# Patient Record
Sex: Female | Born: 1965
Health system: Southern US, Community
[De-identification: ages and names within clinical notes are randomized; demographics above are authoritative.]

## PROBLEM LIST (undated history)

## (undated) DIAGNOSIS — E079 Disorder of thyroid, unspecified: Secondary | ICD-10-CM

## (undated) DIAGNOSIS — N852 Hypertrophy of uterus: Secondary | ICD-10-CM

## (undated) DIAGNOSIS — L719 Rosacea, unspecified: Secondary | ICD-10-CM

## (undated) DIAGNOSIS — R300 Dysuria: Secondary | ICD-10-CM

## (undated) HISTORY — DX: Disorder of thyroid, unspecified: E07.9

## (undated) HISTORY — DX: Rosacea, unspecified: L71.9

## (undated) HISTORY — DX: Hypertrophy of uterus: N85.2

## (undated) HISTORY — DX: Dysuria: R30.0

---

## 2003-02-23 HISTORY — PX: BREAST LUMPECTOMY: SHX2

## 2005-07-09 ENCOUNTER — Encounter (INDEPENDENT_AMBULATORY_CARE_PROVIDER_SITE_OTHER): Payer: Self-pay | Admitting: Specialist

## 2005-07-09 ENCOUNTER — Encounter: Admission: RE | Admit: 2005-07-09 | Discharge: 2005-07-09 | Payer: Self-pay | Admitting: Family Medicine

## 2005-08-04 ENCOUNTER — Encounter: Admission: RE | Admit: 2005-08-04 | Discharge: 2005-08-04 | Payer: Self-pay | Admitting: Surgery

## 2005-08-04 ENCOUNTER — Encounter (INDEPENDENT_AMBULATORY_CARE_PROVIDER_SITE_OTHER): Payer: Self-pay | Admitting: Specialist

## 2005-08-04 ENCOUNTER — Ambulatory Visit (HOSPITAL_BASED_OUTPATIENT_CLINIC_OR_DEPARTMENT_OTHER): Admission: RE | Admit: 2005-08-04 | Discharge: 2005-08-04 | Payer: Self-pay | Admitting: Surgery

## 2006-06-28 ENCOUNTER — Other Ambulatory Visit: Admission: RE | Admit: 2006-06-28 | Discharge: 2006-06-28 | Payer: Self-pay | Admitting: Family Medicine

## 2006-07-21 ENCOUNTER — Encounter: Admission: RE | Admit: 2006-07-21 | Discharge: 2006-07-21 | Payer: Self-pay | Admitting: Family Medicine

## 2006-08-04 ENCOUNTER — Encounter: Admission: RE | Admit: 2006-08-04 | Discharge: 2006-08-04 | Payer: Self-pay | Admitting: Family Medicine

## 2007-02-03 ENCOUNTER — Encounter: Admission: RE | Admit: 2007-02-03 | Discharge: 2007-02-03 | Payer: Self-pay | Admitting: Family Medicine

## 2007-07-24 ENCOUNTER — Encounter: Admission: RE | Admit: 2007-07-24 | Discharge: 2007-07-24 | Payer: Self-pay | Admitting: Family Medicine

## 2007-12-23 IMAGING — MG MM DIAGNOSTIC BILATERAL
8 of 12 series · 8 of 12 positions shown · non-contrast
Comparison: none

DG DIAGNOSTIC BILATERAL
Bilateral CC and MLO view(s) were taken.

BILATERAL BREAST ULTRASOUND
DIGITAL BILATERAL DIAGNOSTIC MAMMOGRAM WITH CAD AND BILATERAL BREAST ULTRASOUND:
CLINICAL DATA: The patient felt a lump in the right upper outer quadrant.  Her physician felt a 
lump in the left anterior upper outer quadrant.

[R CC (1 of 2)]
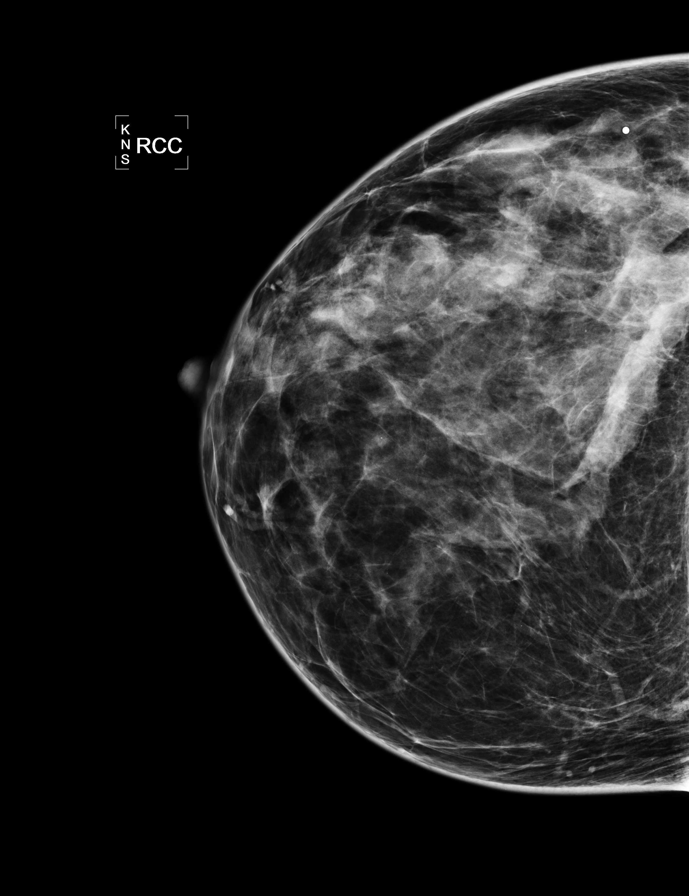

[R CC (2 of 2)]
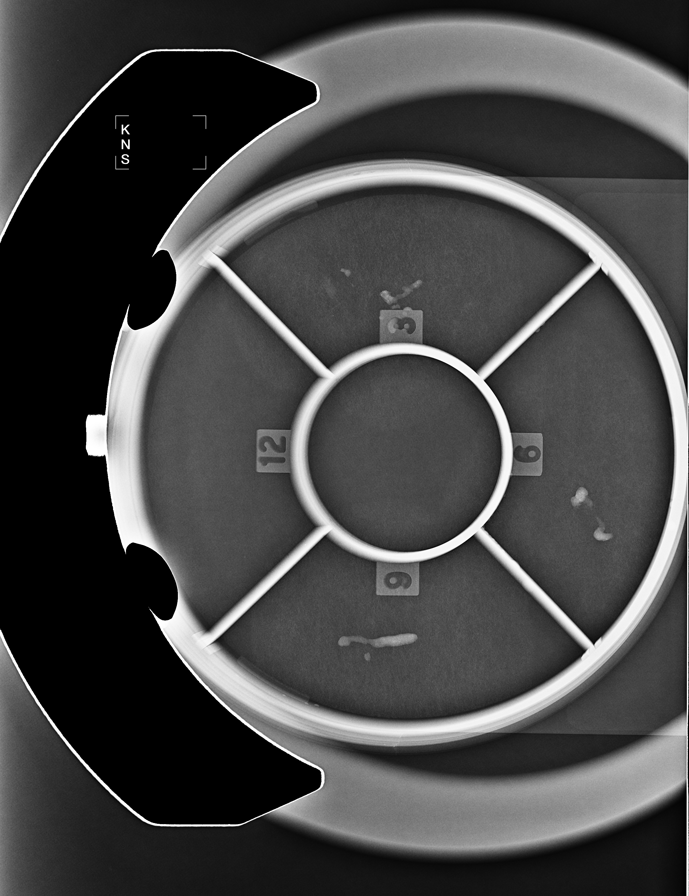

[L CC (1 of 2)]
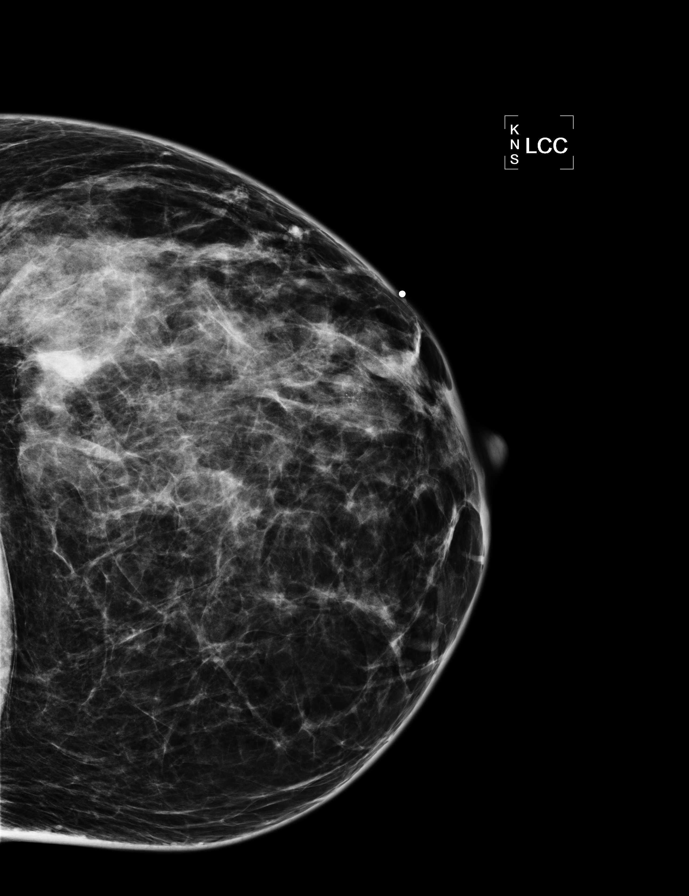

[L MLO]
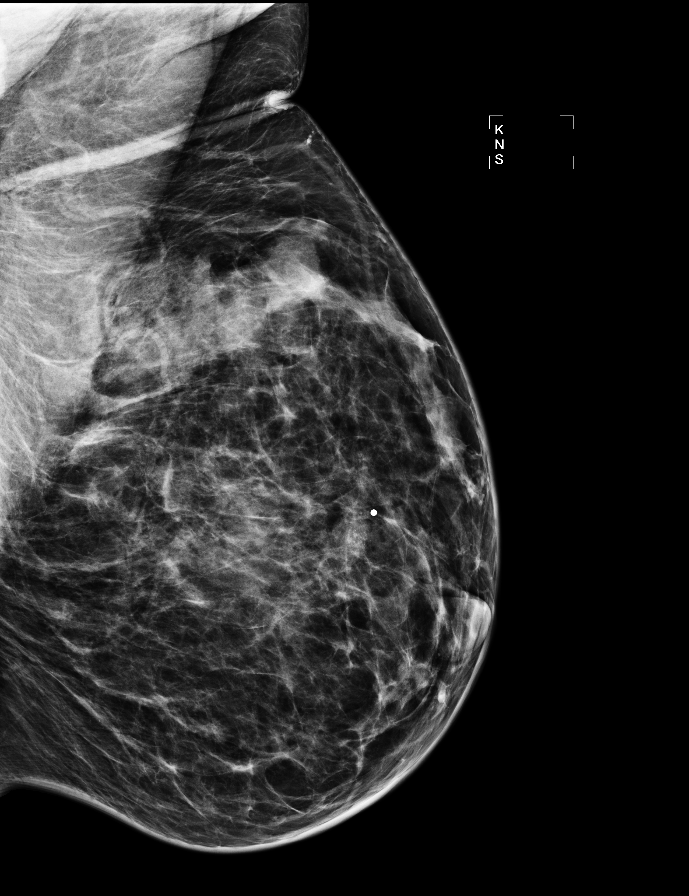

[R MLO]
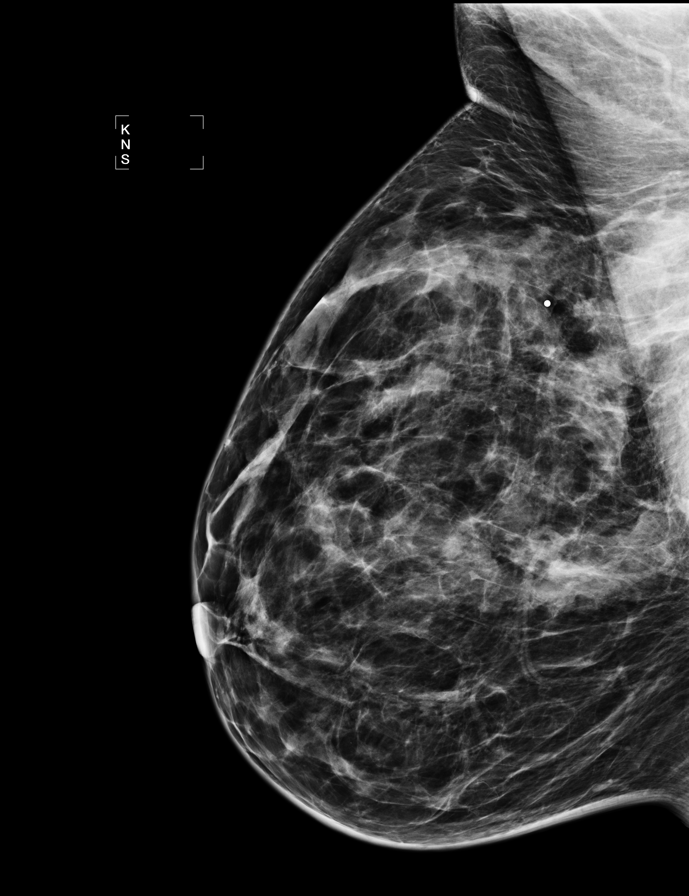

[R TAN]
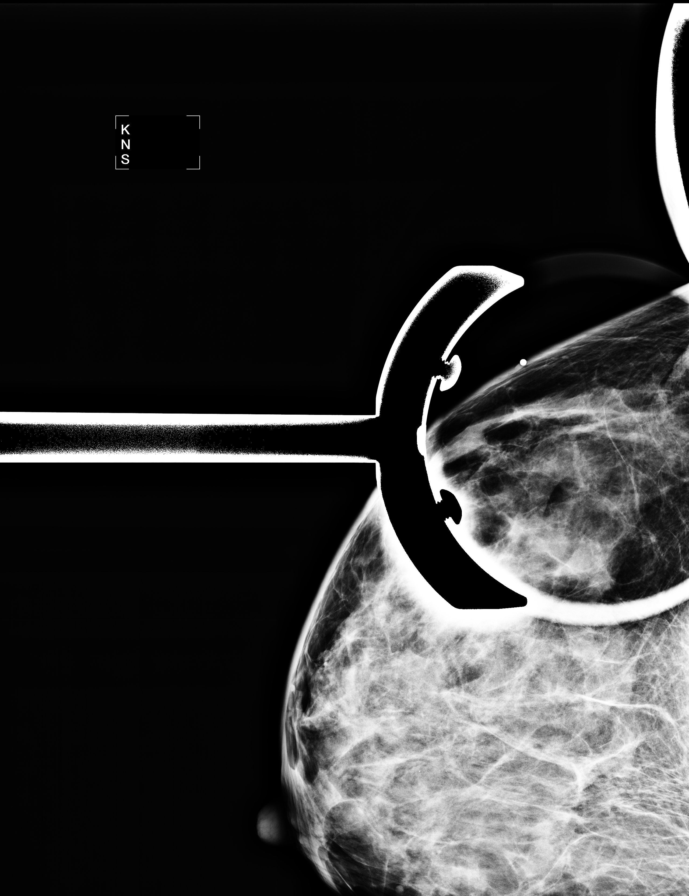

[L TAN]
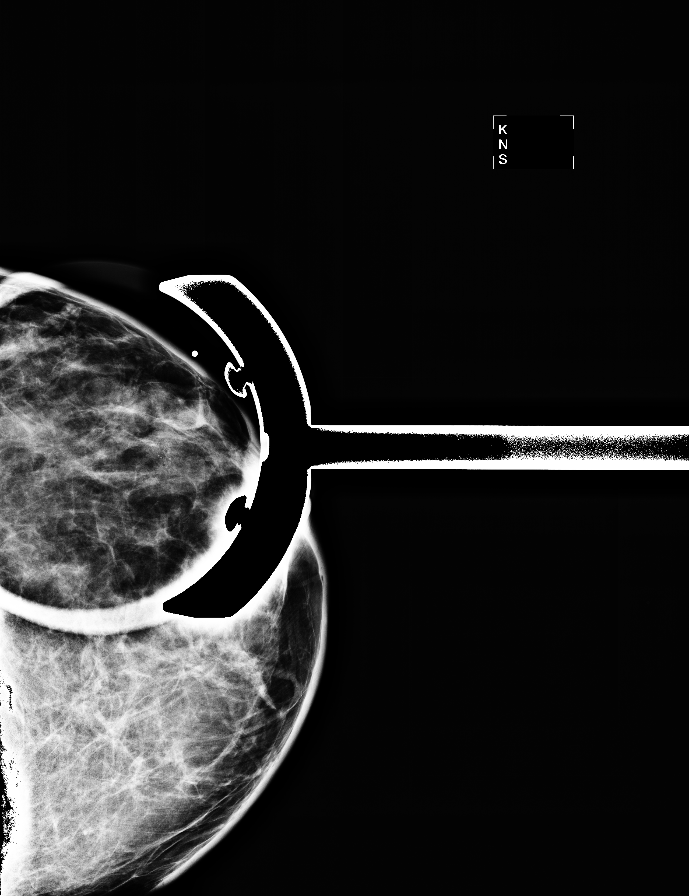

[L CC (2 of 2)]
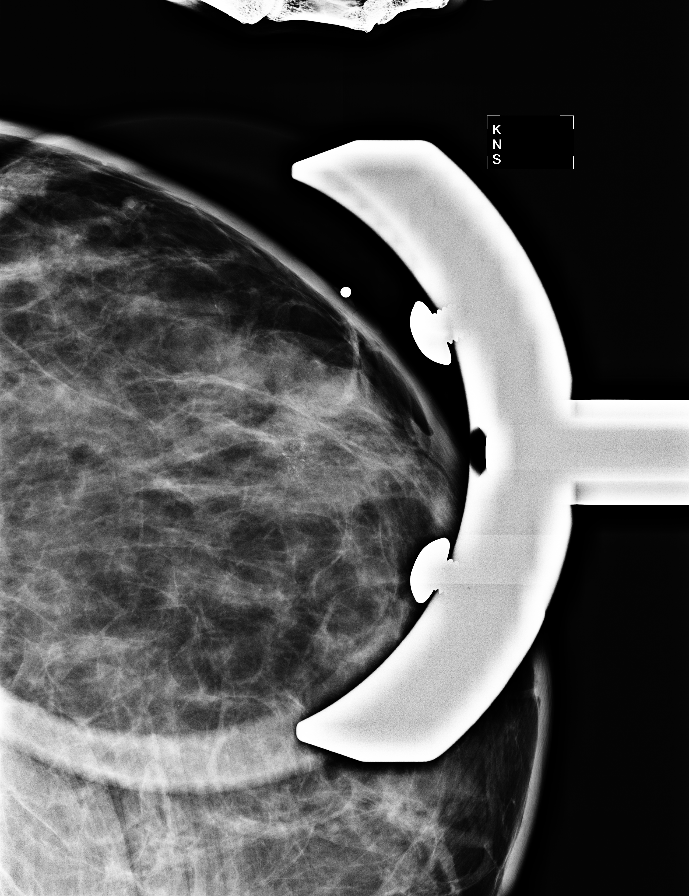

[8 of 12 positions shown; findings below may reference images not displayed]

This is the patient's first mammogram.  There are scattered fibroglandular densities.   There is no
dominant mass, architectural distortion or calcification to suggest malignancy on the right.  In 
the left anterior upper outer quadrant, there is a cluster of microcalcifications that seem to be 
associated with a lobulated nodule.  No other abnormality is noted on the left.

On physical examination, I palpate ridges of thickening in the right upper outer quadrant.  
Sonography demonstrates mixed fibroglandular tissue and fat with no mass, distortion or shadowing 
to suggest malignancy.

On physical examination, I palpate diffuse lumpiness in the left anterior upper outer quadrant.  
Sonography demonstrates mixed fibroglandular tissue and fat.  There is a small oval lobulated 
nodule that appears to have calcifications within it at 1 o'clock in zone 1, measuring 1.0 x
cm.  This has a mildly inhomogeneous appearance.  While this may represent a fibroadenoma, the 
inhomogeneity is somewhat worrisome and consideration of biopsy would be suggested.

Options of surgical excisional biopsy and ultrasound-guided core needle were discussed with the 
patient.  The patient elected to proceed with ultrasound-guided core needle biopsy which will be 
performed and reported separately.
IMPRESSION: Nodule with microcalcifications in the left anterior upper outer quadrant.  The appearance is 
somewhat worrisome and consideration of biopsy is suggested.

No mammographic or sonographic evidence of malignancy in the right breast.

Report was telephoned to Tarla at Dr. [[id number]].

ASSESSMENT: Suspicious - BI-RADS 4

Needle biopsy of the left breast.
ANALYZED BY COMPUTER AIDED DETECTION. ,

## 2008-02-05 ENCOUNTER — Encounter: Admission: RE | Admit: 2008-02-05 | Discharge: 2008-02-05 | Payer: Self-pay | Admitting: Family Medicine

## 2009-02-17 ENCOUNTER — Encounter: Admission: RE | Admit: 2009-02-17 | Discharge: 2009-02-17 | Payer: Self-pay | Admitting: Family Medicine

## 2010-03-17 ENCOUNTER — Encounter
Admission: RE | Admit: 2010-03-17 | Discharge: 2010-03-17 | Payer: Self-pay | Source: Home / Self Care | Attending: Family Medicine | Admitting: Family Medicine

## 2010-07-10 NOTE — Op Note (Signed)
NAMEJANETTE, Kristin Powell                 ACCOUNT NO.:  [id number]   MEDICAL RECORD NO.:  [id number]          PATIENT TYPE:  AMB   LOCATION:  DSC                          FACILITY:  [facility]   PHYSICIAN:  Thomas A. Cornett, M.D.DATE OF BIRTH:  [birth year]   DATE OF PROCEDURE:  08/04/2005  DATE OF DISCHARGE:                                 OPERATIVE REPORT   PREOPERATIVE DIAGNOSIS:  Left breast papilloma.   POSTOPERATIVE DIAGNOSIS:  Left breast papilloma.   PROCEDURE:  Left breast needle localized excisional biopsy.   SURGEON:  Dr. Harriette Bouillon.   ANESTHESIA:  MAC with 30 mL of a mixture of 1% lidocaine and 0.25%  Sensorcaine with epinephrine.   ESTIMATED BLOOD LOSS:  20 mL.   SPECIMEN:  Left breast tissue with Kopans wire intact, verified by  radiograph to be adequate.   INDICATIONS FOR PROCEDURE:  The patient is a 45 year old female who had a  abnormality noted on her mammogram of her left breast.  Core biopsy revealed  a papilloma but  this was not completely resected by core biopsy means.  Excisional biopsy is recommended for complete excision of this papilloma.  I  discussed with the patient.  She understood and agreed to proceed.  The  procedure was discussed at length as well as potential complications of  bleeding, infection, retaining of foreign body with the wire and having to  re-operated if anything else came from the biopsy that was premalignant or  malignant.  She understood and agreed to  proceed.   DESCRIPTION OF PROCEDURE:  The patient was brought to the operating room and  placed supine.  She underwent left breast needle localization earlier by the  radiologist.  Left breast was prepped and draped in sterile fashion.  The  wire was in the left upper outer quadrant about 2 to 3 cm from the nipple  areolar complex.  After infiltration with local anesthesia, curvilinear  incision was made around the wire.  Dissection was carried down to the very  bottom where the wire  was.  She had very dense fibrous tissue.  This area  was excised circumferentially and passed off the field after being oriented  with sutures.  Hemostasis was good excellent using cautery for it.  I then  close the deep layer with 3-0 Vicryl and 4-0 Monocryl was used to close the  skin.  All final counts of sponge, needle  and instruments were found to be correct at this portion of the case.  The  patient was awakened and taken to recovery in satisfactory condition.  All  final counts of sponge, needle and instruments were found to be correct.  Radiograph revealed the wire in its entirety with the tissue specimen to be  adequate.      Thomas A. Cornett, M.D.  Electronically Signed     TAC/MEDQ  D:  08/04/2005  T:  08/04/2005  Job:  [id number]   cc:   Daryl Eastern, M.D.  Fax: [phone]

## 2010-12-09 ENCOUNTER — Other Ambulatory Visit: Payer: Self-pay | Admitting: Obstetrics and Gynecology

## 2010-12-09 ENCOUNTER — Other Ambulatory Visit (HOSPITAL_COMMUNITY)
Admission: RE | Admit: 2010-12-09 | Discharge: 2010-12-09 | Disposition: A | Discharge status: Home / Self Care | Payer: BC Managed Care – PPO | Source: Ambulatory Visit | Attending: Obstetrics and Gynecology | Admitting: Obstetrics and Gynecology

## 2010-12-09 DIAGNOSIS — Z01419 Encounter for gynecological examination (general) (routine) without abnormal findings: Secondary | ICD-10-CM | POA: Insufficient documentation

## 2011-01-07 ENCOUNTER — Encounter (INDEPENDENT_AMBULATORY_CARE_PROVIDER_SITE_OTHER): Payer: Self-pay | Admitting: General Surgery

## 2011-01-08 ENCOUNTER — Encounter (INDEPENDENT_AMBULATORY_CARE_PROVIDER_SITE_OTHER): Payer: Self-pay | Admitting: General Surgery

## 2011-01-08 ENCOUNTER — Ambulatory Visit (INDEPENDENT_AMBULATORY_CARE_PROVIDER_SITE_OTHER): Payer: BC Managed Care – PPO | Admitting: General Surgery

## 2011-01-08 VITALS — BP 124/80 | HR 75 | Temp 98.2°F | Ht <= 58 in | Wt 160.2 lb

## 2011-01-08 DIAGNOSIS — K8020 Calculus of gallbladder without cholecystitis without obstruction: Secondary | ICD-10-CM

## 2011-01-08 NOTE — Progress Notes (Signed)
Patient ID: Kristin Powell, female   DOB: 10-11-1965, 45 y.o.   MRN: [id number]  Chief Complaint  Patient presents with  . Pre-op Exam    eval gallbladder    HPI Kristin Powell is a 45 y.o. female. This patient is referred by Dr. Dion Body for evaluation of symptomatic cholelithiasis. She states that she has had right upper quadrant pain off and on since she was 45 years old. This has been intermittent and occurs approximately once per month with the last episode occurring one week ago. She states that this is brought in by "stress" and with any particular change in her diet. She describes this as starting as a tingle and a right upper quadrant and then progresses throughout the night as a "stabbing" pain. She has some associated nausea and occasional vomiting. She has not found anything to provide relief but the symptoms usually resolve in the morning. She was seen by her primary physician who ordered an ultrasound which demonstrated 2.3 cm mobile gallstone without any findings of acute cholecystitis and her laboratory studies were normal. HPI  Past Medical History  Diagnosis Date  . Dysuria   . Hypertrophy of uterus   . Thyroid disease     Past Surgical History  Procedure Date  . Breast lumpectomy 2005    growth removed from left breast    Family History  Problem Relation Age of Onset  . Cancer Sister     Social History History  Substance Use Topics  . Smoking status: Former Smoker    Quit date: 01/06/1997  . Smokeless tobacco: Not on file  . Alcohol Use: No    No Known Allergies  Current Outpatient Prescriptions  Medication Sig Dispense Refill  . ergocalciferol (VITAMIN D2) 50000 UNITS capsule Take 50,000 Units by mouth once a week.        . levothyroxine (SYNTHROID, LEVOTHROID) 50 MCG tablet Take 50 mcg by mouth daily.          Review of Systems Review of Systems All other review of systems negative or noncontributory except as stated in the HPI  Blood pressure 124/80,  pulse 75, temperature 98.2 F (36.8 C), height 4' (1.219 m), weight 160 lb 3.2 oz (72.666 kg), SpO2 99.00%.  Physical Exam Physical Exam  Vitals reviewed. Constitutional: She is oriented to person, place, and time. She appears well-developed and well-nourished. No distress.  HENT:  Head: Normocephalic and atraumatic.  Mouth/Throat: No oropharyngeal exudate.  Eyes: Conjunctivae and EOM are normal. Pupils are equal, round, and reactive to light. No scleral icterus.  Neck: Normal range of motion. Neck supple. No tracheal deviation present.  Cardiovascular: Normal rate, regular rhythm and normal heart sounds.   Pulmonary/Chest: Effort normal and breath sounds normal. No stridor. No respiratory distress. She has no wheezes.  Abdominal: Soft. Bowel sounds are normal. She exhibits no distension. There is no tenderness. There is no rebound and no guarding.  Musculoskeletal: Normal range of motion. She exhibits no edema.  Neurological: She is alert and oriented to person, place, and time.  Skin: Skin is warm and dry. No rash noted. She is not diaphoretic. No erythema. No pallor.  Psychiatric: She has a normal mood and affect. Her behavior is normal. Judgment and thought content normal.    Data Reviewed [place], labs   Assessment    Symptomatic cholelithiasis  I do think that her symptoms are consistent with symptomatic cholelithiasis. I think that she would probably benefit from cholecystectomy to prevent recurrent symptoms. I discussed  with her the options including medication treatment and surgery versus continued observation as well as the risks of delaying surgery such as acute cholecystitis and need for open surgery. 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 She expressed understanding of the possibility of acute cholecystitis and the need for urgent  surgery. I gave her my contact information and she will call me back when she is interested in continuing with surgery.                       Plan    We will await to hear from her with regards to her and she elected schedule her procedure.       Lodema Pilot DAVID 01/08/2011, 11:39 AM

## 2011-02-18 ENCOUNTER — Encounter (INDEPENDENT_AMBULATORY_CARE_PROVIDER_SITE_OTHER): Payer: Self-pay

## 2011-02-22 ENCOUNTER — Encounter (INDEPENDENT_AMBULATORY_CARE_PROVIDER_SITE_OTHER): Payer: Self-pay | Admitting: Obstetrics and Gynecology

## 2011-09-27 ENCOUNTER — Other Ambulatory Visit: Payer: Self-pay | Admitting: Family Medicine

## 2011-09-27 DIAGNOSIS — Z1231 Encounter for screening mammogram for malignant neoplasm of breast: Secondary | ICD-10-CM

## 2011-10-29 ENCOUNTER — Ambulatory Visit
Admission: RE | Admit: 2011-10-29 | Discharge: 2011-10-29 | Disposition: A | Discharge status: Home / Self Care | Payer: BC Managed Care – PPO | Source: Ambulatory Visit | Attending: Family Medicine | Admitting: Family Medicine

## 2011-10-29 DIAGNOSIS — Z1231 Encounter for screening mammogram for malignant neoplasm of breast: Secondary | ICD-10-CM

## 2012-02-28 ENCOUNTER — Other Ambulatory Visit: Payer: Self-pay | Admitting: Family Medicine

## 2012-02-28 DIAGNOSIS — R10A0 Flank pain, unspecified side: Secondary | ICD-10-CM

## 2012-02-28 DIAGNOSIS — R109 Unspecified abdominal pain: Secondary | ICD-10-CM

## 2012-02-29 ENCOUNTER — Other Ambulatory Visit (HOSPITAL_COMMUNITY): Payer: BC Managed Care – PPO

## 2012-03-06 ENCOUNTER — Ambulatory Visit (HOSPITAL_COMMUNITY)
Admission: RE | Admit: 2012-03-06 | Discharge: 2012-03-06 | Disposition: A | Discharge status: Home / Self Care | Payer: BC Managed Care – PPO | Source: Ambulatory Visit | Attending: Family Medicine | Admitting: Family Medicine

## 2012-03-06 DIAGNOSIS — R932 Abnormal findings on diagnostic imaging of liver and biliary tract: Secondary | ICD-10-CM | POA: Insufficient documentation

## 2012-03-06 DIAGNOSIS — D259 Leiomyoma of uterus, unspecified: Secondary | ICD-10-CM | POA: Insufficient documentation

## 2012-03-06 DIAGNOSIS — K7689 Other specified diseases of liver: Secondary | ICD-10-CM | POA: Insufficient documentation

## 2012-03-06 DIAGNOSIS — R1031 Right lower quadrant pain: Secondary | ICD-10-CM | POA: Insufficient documentation

## 2012-03-06 DIAGNOSIS — R109 Unspecified abdominal pain: Secondary | ICD-10-CM

## 2012-03-06 MED ORDER — IOHEXOL 300 MG/ML  SOLN
100.0000 mL | Freq: Once | INTRAMUSCULAR | Status: AC | PRN
  Administered 2012-03-06: 100 mL via INTRAVENOUS

## 2012-03-14 ENCOUNTER — Other Ambulatory Visit: Payer: Self-pay | Admitting: Family Medicine

## 2012-03-14 DIAGNOSIS — K8020 Calculus of gallbladder without cholecystitis without obstruction: Secondary | ICD-10-CM

## 2012-03-17 ENCOUNTER — Ambulatory Visit (HOSPITAL_COMMUNITY)
Admission: RE | Admit: 2012-03-17 | Discharge: 2012-03-17 | Disposition: A | Discharge status: Home / Self Care | Payer: BC Managed Care – PPO | Source: Ambulatory Visit | Attending: Family Medicine | Admitting: Family Medicine

## 2012-03-17 DIAGNOSIS — K7689 Other specified diseases of liver: Secondary | ICD-10-CM | POA: Insufficient documentation

## 2012-03-17 DIAGNOSIS — K8020 Calculus of gallbladder without cholecystitis without obstruction: Secondary | ICD-10-CM

## 2012-03-17 DIAGNOSIS — R1011 Right upper quadrant pain: Secondary | ICD-10-CM | POA: Insufficient documentation

## 2012-03-17 DIAGNOSIS — R932 Abnormal findings on diagnostic imaging of liver and biliary tract: Secondary | ICD-10-CM | POA: Insufficient documentation

## 2012-03-20 ENCOUNTER — Ambulatory Visit (INDEPENDENT_AMBULATORY_CARE_PROVIDER_SITE_OTHER): Payer: BC Managed Care – PPO | Admitting: General Surgery

## 2012-03-20 ENCOUNTER — Encounter (INDEPENDENT_AMBULATORY_CARE_PROVIDER_SITE_OTHER): Payer: Self-pay | Admitting: General Surgery

## 2012-03-20 VITALS — BP 134/84 | HR 73 | Temp 98.3°F | Ht <= 58 in | Wt 157.8 lb

## 2012-03-20 DIAGNOSIS — K8020 Calculus of gallbladder without cholecystitis without obstruction: Secondary | ICD-10-CM

## 2012-03-20 NOTE — Progress Notes (Signed)
Patient ID: Kristin Powell, female   DOB: 1965-04-03, 47 y.o.   MRN: [id number]  Chief Complaint  Patient presents with  . New Evaluation    eval gallbladder    HPI Kristin Powell is a 47 y.o. female.  The patient's 47 year old female with multiple year history of right upper quadrant pain nausea and vomiting. He says she's got she's had attacks and she was in her teens. Patient was previously seen by Dr. Biagio Quint however never returned for scheduling.  Patient is a she's had some nausea and vomiting after eating any other upper quadrant pain. Patient was evaluated by ultrasound and CT scan which reveals a large stone within the gallbladder. Some thickening of the gallbladder HPI  Past Medical History  Diagnosis Date  . Dysuria   . Hypertrophy of uterus   . Thyroid disease     Past Surgical History  Procedure Date  . Breast lumpectomy 2005    growth removed from left breast    Family History  Problem Relation Age of Onset  . Cancer Sister     ovarian  . Cancer Maternal Grandfather     bone  . Cancer Paternal Grandmother     Social History History  Substance Use Topics  . Smoking status: Former Smoker    Quit date: 01/06/1997  . Smokeless tobacco: Not on file  . Alcohol Use: No    No Known Allergies  Current Outpatient Prescriptions  Medication Sig Dispense Refill  . ergocalciferol (VITAMIN D2) 50000 UNITS capsule Take 50,000 Units by mouth once a week.        . levothyroxine (SYNTHROID, LEVOTHROID) 50 MCG tablet Take 50 mcg by mouth daily.          Review of Systems Review of Systems  Constitutional: Negative.   HENT: Negative.   Cardiovascular: Negative.   Gastrointestinal: Negative.   Musculoskeletal: Negative.   Neurological: Negative.     Blood pressure 134/84, pulse 73, temperature 98.3 F (36.8 C), temperature source Temporal, height 4\' 10"  (1.473 m), weight 157 lb 12.8 oz (71.578 kg), last menstrual period 02/25/2012, SpO2 99.00%.  Physical Exam Physical  Exam  Constitutional: She is oriented to person, place, and time. She appears well-developed and well-nourished.  HENT:  Head: Normocephalic and atraumatic.  Eyes: Conjunctivae normal and EOM are normal. Pupils are equal, round, and reactive to light.  Neck: Normal range of motion. Neck supple.  Cardiovascular: Normal rate, regular rhythm and normal heart sounds.   Pulmonary/Chest: Effort normal and breath sounds normal.  Abdominal: Soft. Bowel sounds are normal. There is tenderness (RUQ).  Musculoskeletal: Normal range of motion.  Neurological: She is alert and oriented to person, place, and time.    Data Reviewed Ultrasound CT scan: A large gallstone in the gallbladder and thickening of gallbladder wall.  Assessment    47 year old female with symptomatic cholelithiasis.    Plan    1. We'll proceed to the operating room for a laparoscopic cholecystectomy without any cholangiogram.  2.All risks and benefits were discussed with the patient, to generally include infection, bleeding, damage to surrounding structures, and recurrence. Alternatives were offered and described.  All questions were answered and the patient voiced understanding of the procedure and wishes to proceed at this point.        Marigene Ehlers., Ayari Liwanag 03/20/2012, 10:15 AM

## 2012-04-20 ENCOUNTER — Encounter (INDEPENDENT_AMBULATORY_CARE_PROVIDER_SITE_OTHER): Payer: BC Managed Care – PPO | Admitting: General Surgery

## 2012-06-05 ENCOUNTER — Ambulatory Visit (INDEPENDENT_AMBULATORY_CARE_PROVIDER_SITE_OTHER): Payer: BC Managed Care – PPO | Admitting: Nurse Practitioner

## 2012-06-05 ENCOUNTER — Encounter: Payer: Self-pay | Admitting: Nurse Practitioner

## 2012-06-05 VITALS — BP 116/75 | HR 70 | Temp 97.9°F | Ht 60.0 in | Wt 154.0 lb

## 2012-06-05 DIAGNOSIS — N63 Unspecified lump in unspecified breast: Secondary | ICD-10-CM

## 2012-06-05 DIAGNOSIS — Z124 Encounter for screening for malignant neoplasm of cervix: Secondary | ICD-10-CM

## 2012-06-05 DIAGNOSIS — Z0000 Encounter for general adult medical examination without abnormal findings: Secondary | ICD-10-CM

## 2012-06-05 DIAGNOSIS — Z01419 Encounter for gynecological examination (general) (routine) without abnormal findings: Secondary | ICD-10-CM

## 2012-06-05 DIAGNOSIS — E039 Hypothyroidism, unspecified: Secondary | ICD-10-CM | POA: Insufficient documentation

## 2012-06-05 DIAGNOSIS — N6310 Unspecified lump in the right breast, unspecified quadrant: Secondary | ICD-10-CM

## 2012-06-05 LAB — POCT URINALYSIS DIPSTICK
Nitrite, UA: NEGATIVE
Protein, UA: NEGATIVE
Urobilinogen, UA: NEGATIVE
pH, UA: 6

## 2012-06-05 LAB — POCT UA - MICROSCOPIC ONLY: Crystals, Ur, HPF, POC: NEGATIVE

## 2012-06-05 LAB — COMPLETE METABOLIC PANEL WITH GFR
AST: 19 U/L (ref 0–37)
Albumin: 4.2 g/dL (ref 3.5–5.2)
BUN: 11 mg/dL (ref 6–23)
CO2: 26 mEq/L (ref 19–32)
Calcium: 8.7 mg/dL (ref 8.4–10.5)
Chloride: 105 mEq/L (ref 96–112)
GFR, Est African American: >89 mL/min
Glucose, Bld: 87 mg/dL (ref 70–99)
Potassium: 3.7 mEq/L (ref 3.5–5.3)

## 2012-06-05 LAB — POCT CBC
Granulocyte percent: 60.7 %G (ref 37–80)
HCT, POC: 41.4 % (ref 37.7–47.9)
Hemoglobin: 13.4 g/dL (ref 12.2–16.2)
MCH, POC: 29.8 pg (ref 27–31.2)
MCV: 91.8 fL (ref 80–97)
Platelet Count, POC: 180 10*3/uL (ref 142–424)
RBC: 4.5 M/uL (ref 4.04–5.48)
WBC: 4.6 10*3/uL (ref 4.6–10.2)

## 2012-06-05 LAB — THYROID PANEL WITH TSH
Free Thyroxine Index: 3.5 (ref 1.0–3.9)
TSH: 2.265 u[IU]/mL (ref 0.350–4.500)

## 2012-06-05 NOTE — Patient Instructions (Signed)

## 2012-06-05 NOTE — Progress Notes (Signed)
  Subjective:    Patient ID: Kristin Powell, female    DOB: 1965/08/25, 47 y.o.   MRN: [id number]  HPI- Patient here today for CPE. She is doing well otherwise.  * C/O right hip pain. Started several months ago. Staying the same. Occurs daily intermittently. Rates pain 4/10. Patient has tried anything to help. Thinks it is worse when she is standing. Denies numbness and tingling down leg. *New knot in right breast. Slightly tender to touch. No nipple discharge, no dimpling. Last mammo was 9/13 which was WNL.    Review of Systems  Constitutional: Negative.   HENT: Negative.   Eyes: Negative.   Respiratory: Negative.   Cardiovascular: Negative.   Gastrointestinal: Negative.   Genitourinary: Negative.   Musculoskeletal: Negative.   Neurological: Negative.   Psychiatric/Behavioral: Negative.        Objective:   Physical Exam  Constitutional: She is oriented to person, place, and time. She appears well-developed and well-nourished.  HENT:  Head: Normocephalic.  Right Ear: Hearing, tympanic membrane, external ear and ear canal normal.  Left Ear: Hearing, tympanic membrane, external ear and ear canal normal.  Nose: Nose normal.  Mouth/Throat: Uvula is midline and oropharynx is clear and moist.  Eyes: Conjunctivae and EOM are normal. Pupils are equal, round, and reactive to light.  Neck: Normal range of motion and full passive range of motion without pain. Neck supple. No JVD present. Carotid bruit is not present. No mass and no thyromegaly present.  Cardiovascular: Normal rate, normal heart sounds and intact distal pulses.   No murmur heard. Pulmonary/Chest: Effort normal and breath sounds normal. Right breast exhibits mass (Right palpable breast mass at 7 o clock position). Right breast exhibits no inverted nipple, no nipple discharge, no skin change and no tenderness. Left breast exhibits mass. Left breast exhibits no inverted nipple, no nipple discharge, no skin change and no tenderness.     Abdominal: Soft. Bowel sounds are normal. She exhibits no mass. There is no tenderness.  Genitourinary: Vagina normal and uterus normal. No breast swelling, tenderness, discharge or bleeding.  bimanual exam-No adnexal masses or tenderness.  Large uterine fibroids palpable right upper uterine wall  Musculoskeletal: Normal range of motion.  Lymphadenopathy:    She has no cervical adenopathy.  Neurological: She is alert and oriented to person, place, and time.  Skin: Skin is warm and dry.  Psychiatric: She has a normal mood and affect. Her behavior is normal. Judgment and thought content normal.    BP 116/75  Pulse 70  Temp(Src) 97.9 F (36.6 C) (Oral)  Ht 5' (1.524 m)  Wt 154 lb (69.854 kg)  BMI 30.08 kg/m2       Assessment & Plan:  1. Encounter for routine gynecological examination  - POCT UA - Microscopic Only - POCT urinalysis dipstick - Pap IG w/ reflex to HPV when ASC-U  2. Unspecified hypothyroidism   3. Physical exam, annual  - POCT CBC - COMPLETE METABOLIC PANEL WITH GFR - NMR Lipoprofile with Lipids - Thyroid Panel With TSH  4. Breast mass, right  - MM Digital Diagnostic Unilat R; Future  Labs pending Diet and exercise Continue current meds Mary-Margaret Daphine Deutscher, FNP

## 2012-06-06 ENCOUNTER — Other Ambulatory Visit: Payer: Self-pay

## 2012-06-06 DIAGNOSIS — N6310 Unspecified lump in the right breast, unspecified quadrant: Secondary | ICD-10-CM

## 2012-06-06 LAB — PAP IG W/ RFLX HPV ASCU

## 2012-06-07 LAB — NMR LIPOPROFILE WITH LIPIDS
Cholesterol, Total: 155 mg/dL (ref <200)
HDL Particle Number: 29.8 umol/L — ABNORMAL LOW (ref >=30.5)
LDL Particle Number: 680 nmol/L (ref <1000)
LDL Size: 21.2 nm (ref >20.5)
Large VLDL-P: 1.8 nmol/L (ref <=2.7)
Small LDL Particle Number: <90 nmol/L (ref <=527)
VLDL Size: 43.5 nm (ref <=46.6)

## 2012-06-19 ENCOUNTER — Ambulatory Visit
Admission: RE | Admit: 2012-06-19 | Discharge: 2012-06-19 | Disposition: A | Discharge status: Home / Self Care | Payer: BC Managed Care – PPO | Source: Ambulatory Visit | Attending: Family Medicine | Admitting: Family Medicine

## 2012-06-19 ENCOUNTER — Ambulatory Visit
Admission: RE | Admit: 2012-06-19 | Discharge: 2012-06-19 | Disposition: A | Discharge status: Home / Self Care | Payer: BC Managed Care – PPO | Source: Ambulatory Visit | Attending: Nurse Practitioner | Admitting: Nurse Practitioner

## 2012-06-19 DIAGNOSIS — N6310 Unspecified lump in the right breast, unspecified quadrant: Secondary | ICD-10-CM

## 2012-07-10 ENCOUNTER — Telehealth: Payer: Self-pay | Admitting: Nurse Practitioner

## 2012-07-10 NOTE — Telephone Encounter (Signed)
Copy of labs left for pt to pick up and pt aware

## 2012-08-09 ENCOUNTER — Ambulatory Visit: Payer: BC Managed Care – PPO

## 2012-08-18 ENCOUNTER — Telehealth: Payer: Self-pay | Admitting: Nurse Practitioner

## 2012-08-18 NOTE — Telephone Encounter (Signed)
PATIENT AWARE AND WAS OFFERED A APPT FOR SAT AM AND SHE WAS UNABLE TO COME. SHE STATED THAT SHE WILL CALL BACK NEXT WEEK FOR AN APPT

## 2012-08-18 NOTE — Telephone Encounter (Signed)
NTBS.

## 2012-11-22 ENCOUNTER — Telehealth: Payer: Self-pay | Admitting: Nurse Practitioner

## 2012-11-22 NOTE — Telephone Encounter (Signed)
When did you have mammogram because the last report we have was from 05/2012. Has he had another one?

## 2012-11-23 ENCOUNTER — Other Ambulatory Visit: Payer: Self-pay | Admitting: Nurse Practitioner

## 2012-11-23 DIAGNOSIS — R928 Other abnormal and inconclusive findings on diagnostic imaging of breast: Secondary | ICD-10-CM

## 2012-11-23 DIAGNOSIS — Z1231 Encounter for screening mammogram for malignant neoplasm of breast: Secondary | ICD-10-CM

## 2012-11-23 NOTE — Telephone Encounter (Signed)
Patient had an U/S and they told her to come back in 5 mths for another mamo. But they told her to talk to you first to see what kind to do

## 2012-12-19 ENCOUNTER — Ambulatory Visit
Admission: RE | Admit: 2012-12-19 | Discharge: 2012-12-19 | Disposition: A | Discharge status: Home / Self Care | Payer: BC Managed Care – PPO | Source: Ambulatory Visit | Attending: Nurse Practitioner | Admitting: Nurse Practitioner

## 2012-12-19 DIAGNOSIS — Z1231 Encounter for screening mammogram for malignant neoplasm of breast: Secondary | ICD-10-CM

## 2012-12-27 ENCOUNTER — Encounter: Payer: Self-pay | Admitting: *Deleted

## 2012-12-27 NOTE — Progress Notes (Signed)
Quick Note:  Copy of labs sent to patient ______ 

## 2013-04-18 ENCOUNTER — Encounter (INDEPENDENT_AMBULATORY_CARE_PROVIDER_SITE_OTHER): Payer: Self-pay

## 2013-04-18 ENCOUNTER — Ambulatory Visit: Payer: BC Managed Care – PPO

## 2013-08-23 ENCOUNTER — Ambulatory Visit: Payer: BC Managed Care – PPO

## 2013-08-23 ENCOUNTER — Encounter: Payer: Self-pay | Admitting: Nurse Practitioner

## 2013-08-23 ENCOUNTER — Ambulatory Visit (INDEPENDENT_AMBULATORY_CARE_PROVIDER_SITE_OTHER): Payer: BC Managed Care – PPO

## 2013-08-23 ENCOUNTER — Ambulatory Visit (INDEPENDENT_AMBULATORY_CARE_PROVIDER_SITE_OTHER): Payer: BC Managed Care – PPO | Admitting: Nurse Practitioner

## 2013-08-23 VITALS — BP 110/64 | HR 65 | Temp 98.0°F | Ht 60.0 in | Wt 158.0 lb

## 2013-08-23 DIAGNOSIS — M79609 Pain in unspecified limb: Secondary | ICD-10-CM

## 2013-08-23 DIAGNOSIS — M79672 Pain in left foot: Principal | ICD-10-CM

## 2013-08-23 DIAGNOSIS — M7732 Calcaneal spur, left foot: Secondary | ICD-10-CM

## 2013-08-23 DIAGNOSIS — M7730 Calcaneal spur, unspecified foot: Secondary | ICD-10-CM

## 2013-08-23 DIAGNOSIS — M79671 Pain in right foot: Secondary | ICD-10-CM

## 2013-08-23 DIAGNOSIS — M7731 Calcaneal spur, right foot: Secondary | ICD-10-CM

## 2013-08-23 MED ORDER — PREDNISONE (PAK) 10 MG PO TABS: ORAL_TABLET | Freq: Every day | ORAL | Status: DC

## 2013-08-23 NOTE — Progress Notes (Signed)
   Subjective:    Patient ID: Kristin Powell, female    DOB: March 12, 1965, 48 y.o.   MRN: [id number]  HPI  Patient is here today for bilateral foot pain that has been going on for a year. It has exacerbate lately. The right hurts more than the left. Most the heel and pain started to radiate to the back of her right leg. Denies any history of heel spur or plantar fasciitis. Reported family history of RA. She reported she has not tried any treatment. And symptom are slowly worsening. Takes vit D.     Review of Systems  Constitutional: Negative.   HENT: Negative.   Eyes: Negative.   Respiratory: Negative.   Cardiovascular: Negative.   Gastrointestinal: Negative.   Endocrine: Negative.   Genitourinary: Negative.   Musculoskeletal:       Bil foot pain.   Skin: Negative.   Allergic/Immunologic: Negative.   Neurological: Negative.   Hematological: Negative.   Psychiatric/Behavioral: Negative.        Objective:   Physical Exam  Constitutional: She appears well-developed and well-nourished.  HENT:  Head: Normocephalic.  Eyes: Pupils are equal, round, and reactive to light.  Neck: Normal range of motion. Neck supple.  Cardiovascular: Normal rate and regular rhythm.   Pulmonary/Chest: She is in respiratory distress.  Musculoskeletal: Normal range of motion. She exhibits tenderness (bilateral foot ).  Neurological: She is alert.   BP 110/64  Pulse 65  Temp(Src) 98 F (36.7 C) (Oral)  Ht 5' (1.524 m)  Wt 158 lb (71.668 kg)  BMI 30.86 kg/m2  Right foot x ray- heel spur-Preliminary reading by Ronnald Collum, FNP  [facility] Left foot xray- heel spur-Preliminary reading by Ronnald Collum, FNP  [facility]      Assessment & Plan:   1. Foot pain, bilateral   2. Heel spur, right   3. Heel spur, left    Meds ordered this encounter  Medications  . predniSONE (STERAPRED UNI-PAK) 10 MG tablet    Sig: Take by mouth daily. As directed X 6 days    Dispense:  21 tablet    Refill:  0    Order Specific  Question:  Supervising Provider    Answer:  Chipper Herb [[id number]]   Shoe cushions RTO prn  Mary-Margaret Hassell Done, FNP

## 2013-08-23 NOTE — Patient Instructions (Signed)
Heel Spur A heel spur is a hook of bone that can form on the calcaneus (the heel bone and the largest bone of the foot). Heel spurs are often associated with plantar fasciitis and usually come in people who have had the problem for an extended period of time. The cause of the relationship is unknown. The pain associated with them is thought to be caused by an inflammation (soreness and redness) of the plantar fascia rather than the spur itself. The plantar fascia is a thick fibrous like tissue that runs from the calcaneus (heel bone) to the ball of the foot. This strong, tight tissue helps maintain the arch of your foot. It helps distribute the weight across your foot as you walk or run. Stresses placed on the plantar fascia can be tremendous. When it is inflamed normal activities become painful. Pain is worse in the morning after sleeping. After sleeping the plantar fascia is tight. The first movements stretch the fascia and this causes pain. As the tendon loosens, the pain usually gets better. It often returns with too much standing or walking.  About 70% of patients with plantar fasciitis have a heel spur. About half of people without foot pain also have heel spurs. DIAGNOSIS  The diagnosis of a heel spur is made by X-ray. The X-ray shows a hook of bone protruding from the bottom of the calcaneus at the point where the plantar fascia is attached to the heel bone.  TREATMENT  It is necessary to find out what is causing the stretching of the plantar fascia. If the cause is over-pronation (flat feet), orthotics and proper foot ware may help.  Stretching exercises, losing weight, wearing shoes that have a cushioned heel that absorbs shock, and elevating the heel with the use of a heel cradle, heel cup, or orthotics may all help. Heel cradles and heel cups provide extra comfort and cushion to the heel, and reduce the amount of shock to the sore area. AVOIDING THE PAIN OF PLANTAR FASCIITIS AND HEEL  SPURS  Consult a sports medicine professional before beginning a new exercise program.  Walking programs offer a good workout. There is a lower chance of overuse injuries common to the runners. There is less impact and less jarring of the joints.  Begin all new exercise programs slowly. If problems or pains develop, decrease the amount of time or distance until you are at a comfortable level.  Wear good shoes and replace them regularly.  Stretch your foot and the heel cords at the back of the ankle (Achilles tendons) both before and after exercise.  Run or exercise on even surfaces that are not hard. For example, asphalt is better than pavement.  Do not run barefoot on hard surfaces.  If using a treadmill, vary the incline.  Do not continue to workout if you have foot or joint problems. Seek professional help if they do not improve. HOME CARE INSTRUCTIONS   Avoid activities that cause you pain until you recover.  Use ice or cold packs to the problem or painful areas after working out.  Only take over-the-counter or prescription medicines for pain, discomfort, or fever as directed by your caregiver.  Soft shoe inserts or athletic shoes with air or gel sole cushions may be helpful.  If problems continue or become more severe, consult a sports medicine caregiver. Cortisone is a potent anti-inflammatory medication that may be injected into the painful area. You can discuss this treatment with your caregiver. MAKE SURE YOU:  Understand these instructions.  Will watch your condition.  Will get help right away if you are not doing well or get worse. Document Released: 03/17/2005 Document Revised: 05/03/2011 Document Reviewed: 05/19/2005 [facility] Patient Information 2015 [place], [place]. This information is not intended to replace advice given to you by your health care provider. Make sure you discuss any questions you have with your health care provider.

## 2013-12-14 ENCOUNTER — Encounter: Payer: Self-pay | Admitting: Nurse Practitioner

## 2013-12-14 ENCOUNTER — Ambulatory Visit (INDEPENDENT_AMBULATORY_CARE_PROVIDER_SITE_OTHER): Payer: BC Managed Care – PPO | Admitting: Nurse Practitioner

## 2013-12-14 ENCOUNTER — Ambulatory Visit (INDEPENDENT_AMBULATORY_CARE_PROVIDER_SITE_OTHER): Payer: BC Managed Care – PPO

## 2013-12-14 VITALS — BP 116/78 | HR 79 | Temp 97.3°F | Ht 60.0 in | Wt 159.4 lb

## 2013-12-14 DIAGNOSIS — M25551 Pain in right hip: Secondary | ICD-10-CM

## 2013-12-14 DIAGNOSIS — Z23 Encounter for immunization: Secondary | ICD-10-CM

## 2013-12-14 DIAGNOSIS — M79672 Pain in left foot: Secondary | ICD-10-CM

## 2013-12-14 DIAGNOSIS — M79671 Pain in right foot: Secondary | ICD-10-CM

## 2013-12-14 DIAGNOSIS — R5383 Other fatigue: Secondary | ICD-10-CM

## 2013-12-14 NOTE — Progress Notes (Signed)
   Subjective:    Patient ID: Kristin Powell, female    DOB: 04-25-1965, 48 y.o.   MRN: [id number]  HPI Patient here today with 3 complaints: - continued foot pain- xrays were taken at last visit which were negative- She says they are still hurting especially the right foot. Pain is all the time now. Pain is lateral and in heel bil.  - right hip pain- intermittent pain- rates pain 06/03/2022- grandfather died of bine cancer that started in his hip and she just wants it checked. - fatigue- has been coming on for awhile. Says that she works crazy hours and does not get a lot of sleep due to her work hours.   Review of Systems  Constitutional: Positive for fatigue. Negative for activity change and appetite change.  HENT: Negative.   Respiratory: Negative.   Cardiovascular: Negative.   Gastrointestinal: Negative.   Genitourinary: Negative.   Neurological: Negative.   Psychiatric/Behavioral: Negative.        Objective:   Physical Exam  Constitutional: She is oriented to person, place, and time. She appears well-developed and well-nourished.  Cardiovascular: Normal rate, regular rhythm and normal heart sounds.   Pulmonary/Chest: Effort normal and breath sounds normal.  Musculoskeletal:  FROM of right hip without pain- no point tenderness  Neurological: She is alert and oriented to person, place, and time.  Skin: Skin is warm and dry.  Psychiatric: She has a normal mood and affect. Her behavior is normal. Judgment and thought content normal.   BP 116/78  Pulse 79  Temp(Src) 97.3 F (36.3 C) (Oral)  Ht 5' (1.524 m)  Wt 159 lb 6.4 oz (72.303 kg)  BMI 31.13 kg/m2  Right hip xray- normal-Preliminary reading by Ronnald Collum, FNP  [facility]        Assessment & Plan:  1. Right hip pain - DG Hip Complete Right; Future - Arthritis Panel  2. Other fatigue Labs pending - Anemia Profile B - Thyroid Panel With TSH  3. Bilateral foot pain Will wait on arthritis panel- may do ortho referral or  rheumatology depending on lab results Get some good support shoes  Mary-Margaret Hassell Done, FNP

## 2013-12-14 NOTE — Patient Instructions (Signed)

## 2013-12-15 LAB — ANEMIA PROFILE B
BASOS: 1 %
Basophils Absolute: 0 10*3/uL (ref 0.0–0.2)
EOS ABS: 0.1 10*3/uL (ref 0.0–0.4)
Eos: 1 %
Ferritin: 71 ng/mL (ref 15–150)
Folate: 15.5 ng/mL (ref >3.0)
HEMATOCRIT: 40.2 % (ref 34.0–46.6)
HEMOGLOBIN: 13.6 g/dL (ref 11.1–15.9)
Immature Grans (Abs): 0 10*3/uL (ref 0.0–0.1)
Immature Granulocytes: 0 %
Iron Saturation: 30 % (ref 15–55)
Iron: 84 ug/dL (ref 35–155)
LYMPHS ABS: 1.5 10*3/uL (ref 0.7–3.1)
LYMPHS: 28 %
MCH: 31.2 pg (ref 26.6–33.0)
MCHC: 33.8 g/dL (ref 31.5–35.7)
MCV: 92 fL (ref 79–97)
Monocytes Absolute: 0.4 10*3/uL (ref 0.1–0.9)
Monocytes: 7 %
NEUTROS ABS: 3.4 10*3/uL (ref 1.4–7.0)
Neutrophils Relative %: 63 %
Platelets: 193 10*3/uL (ref 150–379)
RBC: 4.36 x10E6/uL (ref 3.77–5.28)
RDW: 13 % (ref 12.3–15.4)
RETIC CT PCT: 0.9 % (ref 0.6–2.6)
TIBC: 284 ug/dL (ref 250–450)
UIBC: 200 ug/dL (ref 150–375)
Vitamin B-12: 605 pg/mL (ref 211–946)
WBC: 5.3 10*3/uL (ref 3.4–10.8)

## 2013-12-15 LAB — THYROID PANEL WITH TSH
FREE THYROXINE INDEX: 2 (ref 1.2–4.9)
T3 Uptake Ratio: 29 % (ref 24–39)
T4 TOTAL: 6.8 ug/dL (ref 4.5–12.0)
TSH: 1.56 u[IU]/mL (ref 0.450–4.500)

## 2013-12-15 LAB — ARTHRITIS PANEL
Rhuematoid fact SerPl-aCnc: 8.8 IU/mL (ref 0.0–13.9)
Sed Rate: 2 mm/hr (ref 0–32)
Uric Acid: 3.3 mg/dL (ref 2.5–7.1)

## 2013-12-17 ENCOUNTER — Other Ambulatory Visit: Payer: Self-pay | Admitting: Nurse Practitioner

## 2013-12-17 DIAGNOSIS — M25579 Pain in unspecified ankle and joints of unspecified foot: Secondary | ICD-10-CM

## 2014-04-13 IMAGING — MG MM SCREEN MAMMOGRAM BILATERAL
4 series · 4 of 4 positions shown · non-contrast
Comparison: Previous exams.

CLINICAL DATA: Screening.

DIGITAL BILATERAL SCREENING MAMMOGRAM WITH CAD

[R CC]
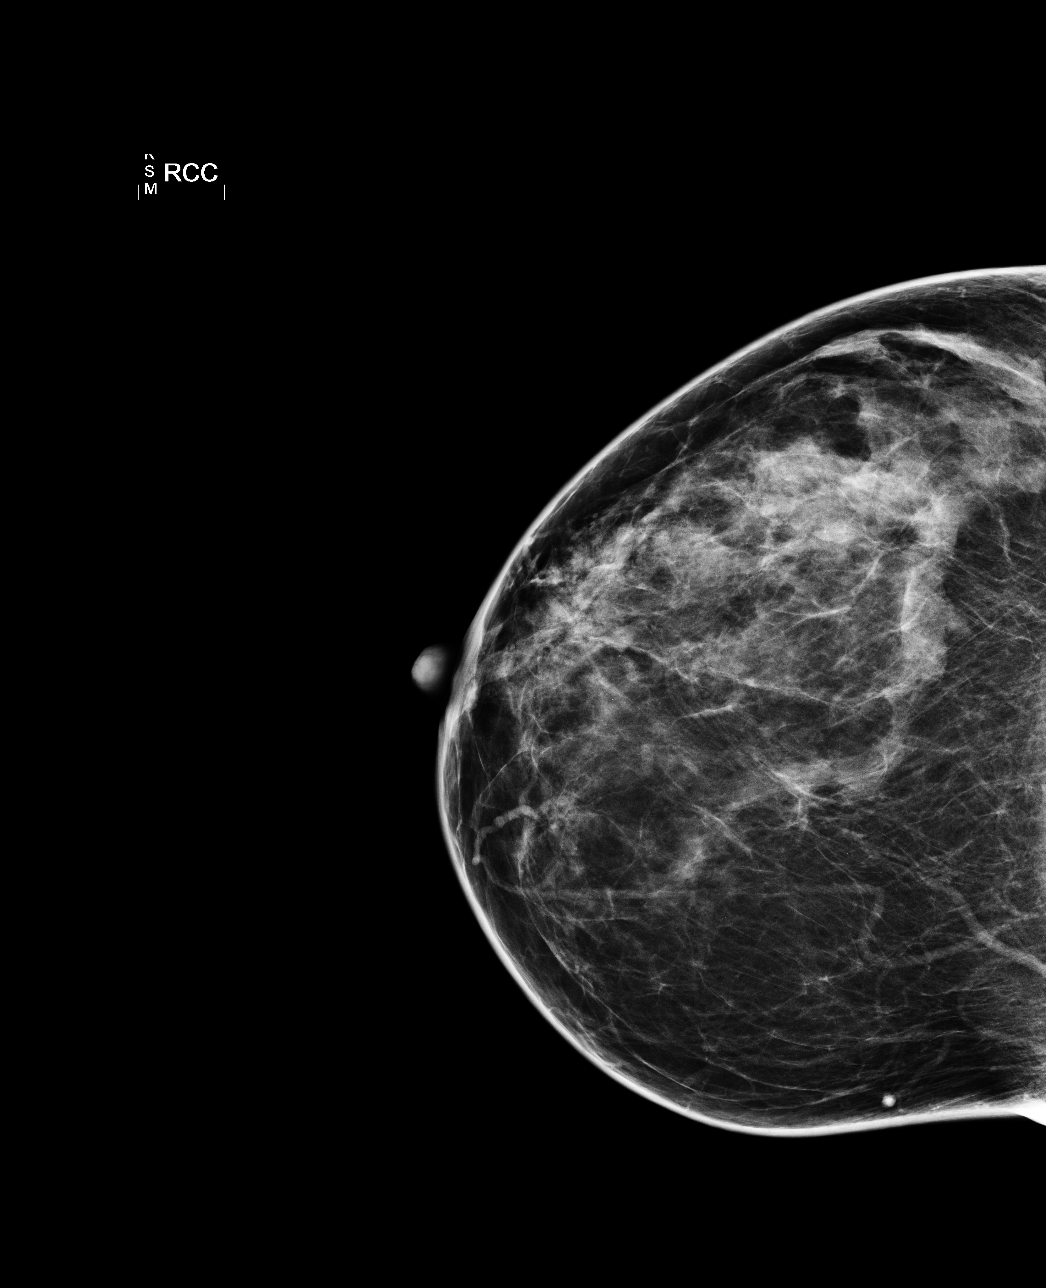

[L CC]
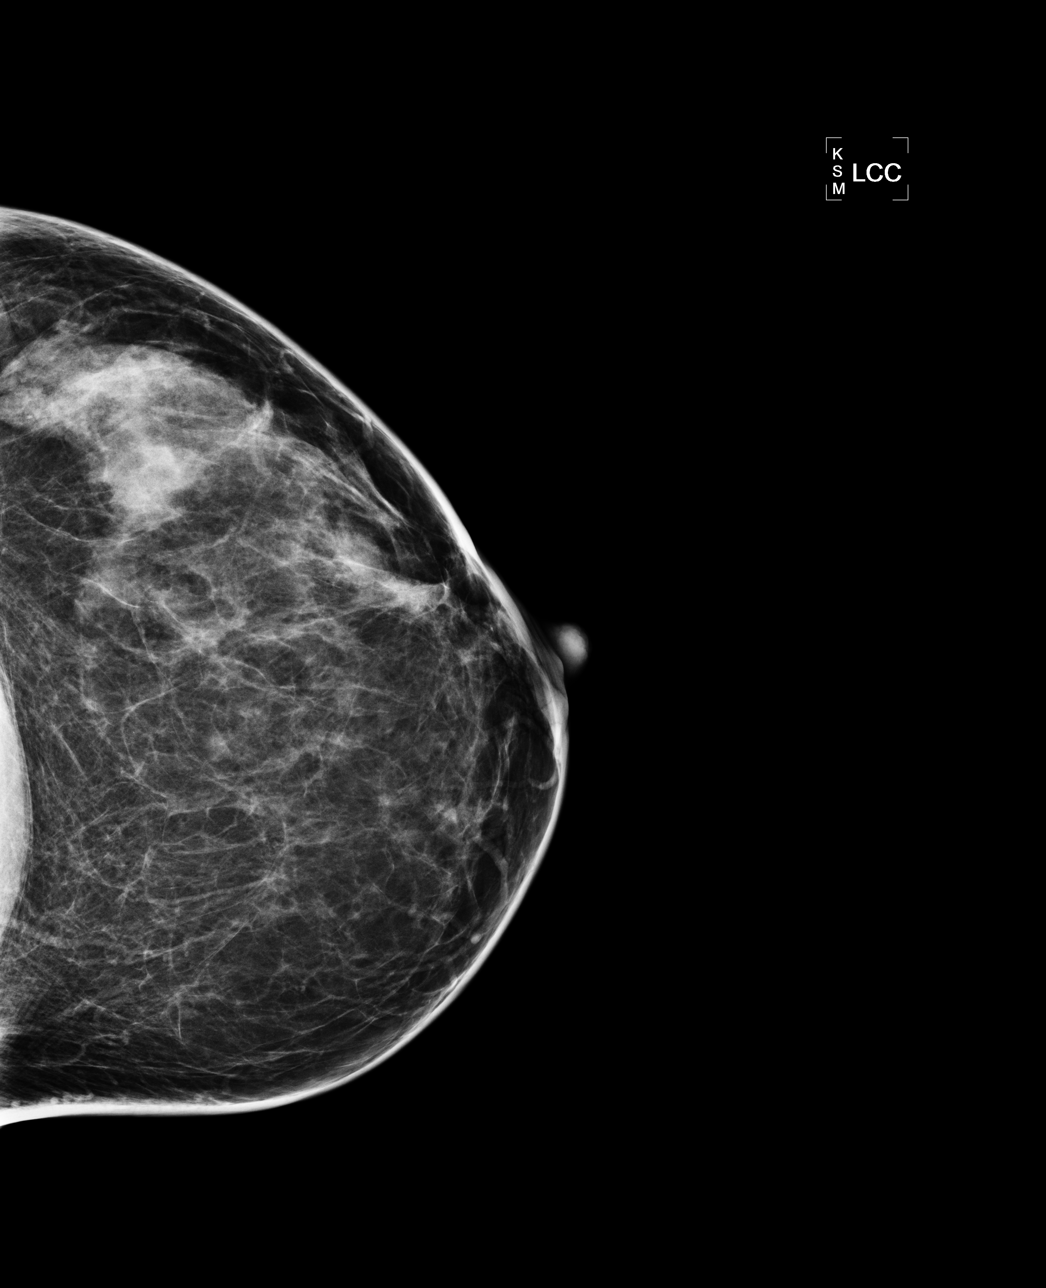

[L MLO]
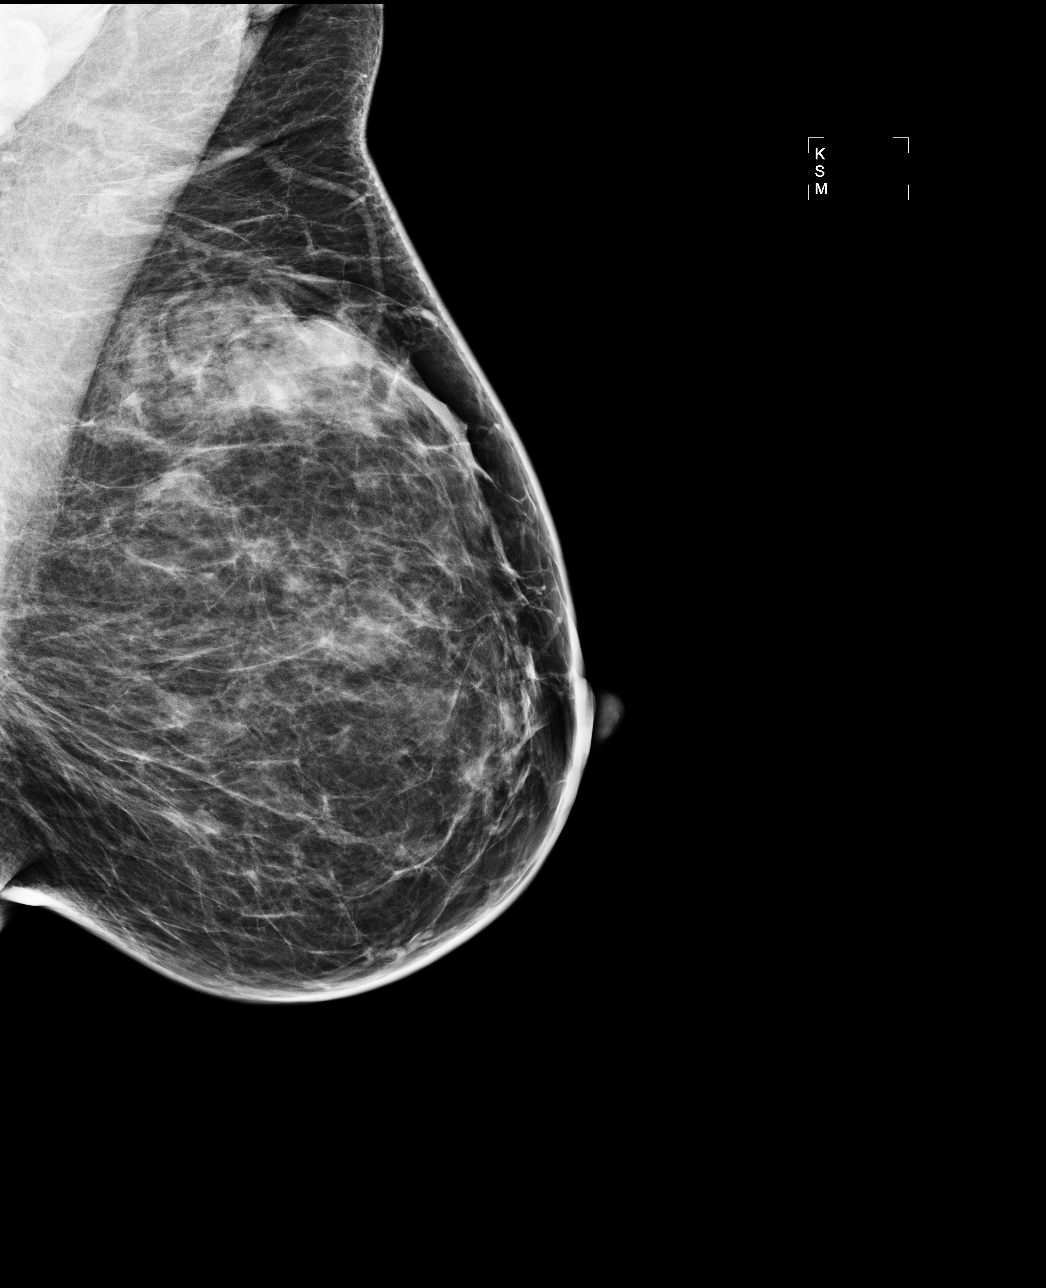

[R MLO]
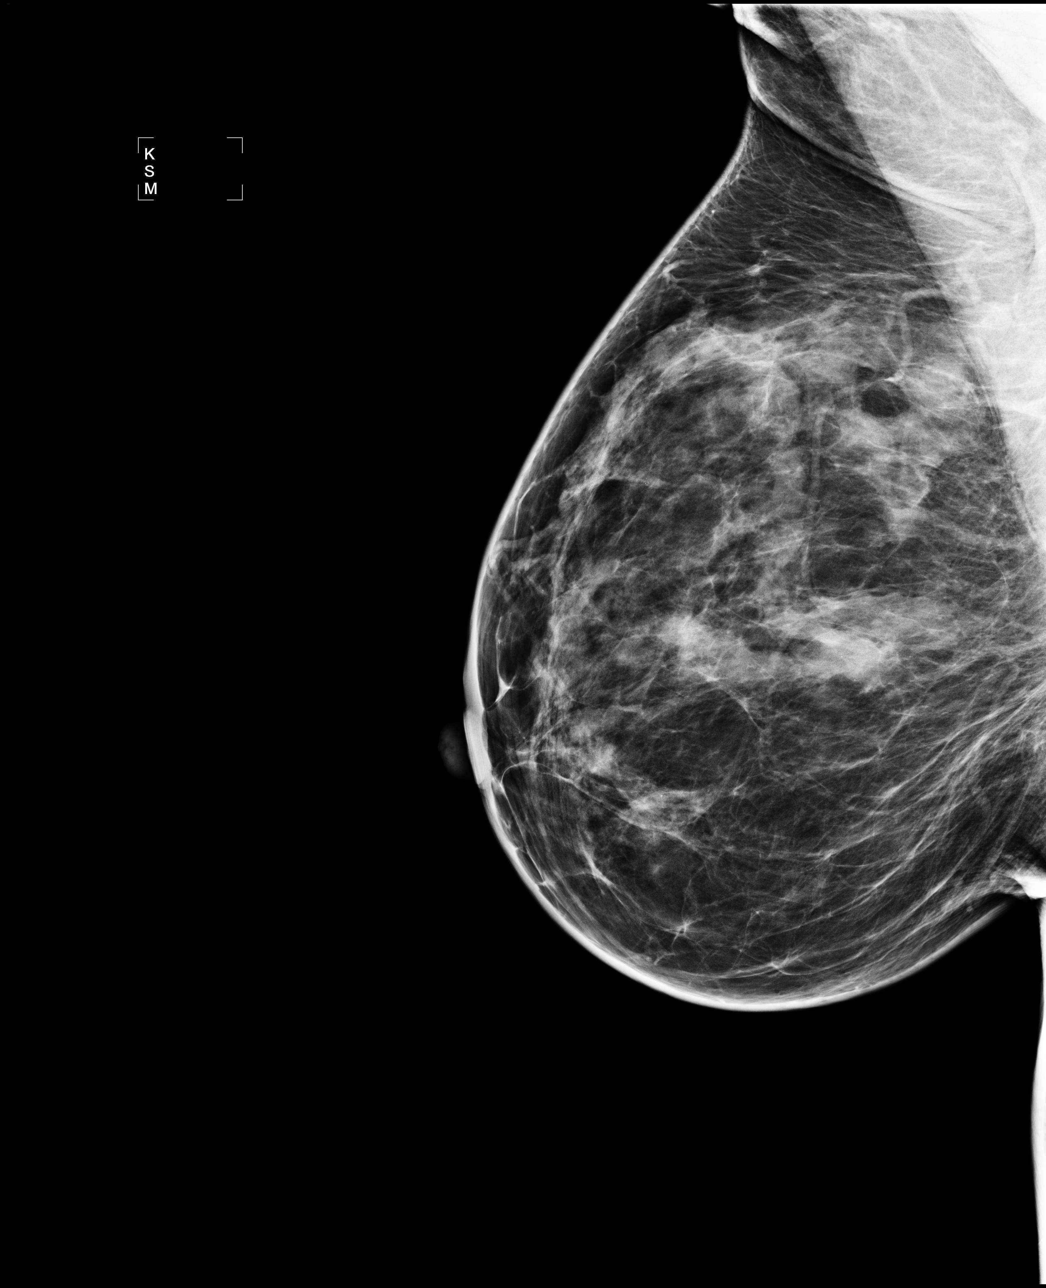

[4 of 4 positions shown; findings below may reference images not displayed]

FINDINGS: The breast tissue is heterogeneously dense. No
suspicious masses, architectural distortion, or calcifications are
present.

Images were processed with CAD.
IMPRESSION: No mammographic evidence of malignancy.

A result letter of this screening mammogram will be mailed directly
to the patient.

RECOMMENDATION:
Screening mammogram in one year. (Code:[id number])

BI-RADS CATEGORY 1:  Negative.

## 2014-05-13 ENCOUNTER — Other Ambulatory Visit: Payer: Self-pay

## 2014-05-13 DIAGNOSIS — Z1231 Encounter for screening mammogram for malignant neoplasm of breast: Secondary | ICD-10-CM

## 2014-06-04 ENCOUNTER — Ambulatory Visit
Admission: RE | Admit: 2014-06-04 | Discharge: 2014-06-04 | Disposition: A | Discharge status: Home / Self Care | Payer: BLUE CROSS/BLUE SHIELD | Source: Ambulatory Visit

## 2014-06-04 DIAGNOSIS — Z1231 Encounter for screening mammogram for malignant neoplasm of breast: Secondary | ICD-10-CM

## 2014-07-19 ENCOUNTER — Other Ambulatory Visit: Payer: Self-pay | Admitting: Nurse Practitioner

## 2014-07-19 NOTE — Telephone Encounter (Signed)
This medication not on med list  Patient last seen 12/14/13  MMM

## 2014-07-20 NOTE — Telephone Encounter (Signed)
NTBS for medication

## 2014-07-23 NOTE — Telephone Encounter (Signed)
Patients husband notified that she needs to be seen. Will call back and make appt

## 2014-07-30 ENCOUNTER — Encounter: Payer: Self-pay | Admitting: Physician Assistant

## 2014-07-30 ENCOUNTER — Ambulatory Visit (INDEPENDENT_AMBULATORY_CARE_PROVIDER_SITE_OTHER): Payer: BLUE CROSS/BLUE SHIELD | Admitting: Physician Assistant

## 2014-07-30 VITALS — BP 105/68 | HR 69 | Temp 97.8°F | Ht 60.0 in | Wt 166.0 lb

## 2014-07-30 DIAGNOSIS — B354 Tinea corporis: Secondary | ICD-10-CM | POA: Diagnosis not present

## 2014-07-30 DIAGNOSIS — L719 Rosacea, unspecified: Secondary | ICD-10-CM

## 2014-07-30 MED ORDER — METRONIDAZOLE 0.75 % EX CREA
TOPICAL_CREAM | Freq: Every day | CUTANEOUS | Status: DC
Start: 2014-07-30 — End: 2015-05-05

## 2014-07-30 MED ORDER — FLUCONAZOLE 150 MG PO TABS: ORAL_TABLET | ORAL | Status: DC

## 2014-07-30 NOTE — Progress Notes (Signed)
   Subjective:    Patient ID: Kristin Powell, female    DOB: 10-21-1965, 49 y.o.   MRN: [id number]  HPI 49 y/o female with h/o rosacea presents for refill of metronidazole. She also has concerns because her husband was diagnosed with a fungal infection and she used one of his towels. Since using the towel, she has noticed areas of white on her face with occasional itch.   She tolerated the Metronidazole well and feels that it kept her Rosacea under control.     Review of Systems  Skin: Positive for color change (white lesions on face, occasional itch  ).       Objective:   Physical Exam  Constitutional: She is oriented to person, place, and time. She appears well-developed and well-nourished. No distress.  Pulmonary/Chest: Effort normal.  Neurological: She is alert and oriented to person, place, and time.  Skin: She is not diaphoretic.  hypopigmented macules on face indicative of fungal etiology. Rosacea is under control at this time.  Psychiatric: She has a normal mood and affect. Her behavior is normal. Judgment and thought content normal.  Nursing note and vitals reviewed.         Assessment & Plan:

## 2014-07-30 NOTE — Patient Instructions (Signed)
Rosacea Rosacea is a long-term (chronic) condition that affects the skin of the face (cheeks, nose, brow, and chin) and sometimes the eyes. Rosacea causes the blood vessels near the surface of the skin to enlarge, resulting in redness. This condition usually begins after age 49. It occurs most often in light-skinned women. Without treatment, rosacea tends to get worse over time. There is no cure for rosacea, but treatment can help control your symptoms. CAUSES  The cause is unknown. It is thought that some people may inherit a tendency to develop rosacea. Certain triggers can make your rosacea worse, including:  Hot baths.  Exercise.  Sunlight.  Very hot or cold temperatures.  Hot or spicy foods and drinks.  Drinking alcohol.  Stress.  Taking blood pressure medicine.  Long-term use of topical steroids on the face. SYMPTOMS   Redness of the face.  Red bumps or pimples on the face.  Red, enlarged nose (rhinophyma).  Blushing easily.  Red lines on the skin.  Irritated or burning feeling in the eyes.  Swollen eyelids. DIAGNOSIS  Your caregiver can usually tell what is wrong by asking about your symptoms and performing a physical exam. TREATMENT  Avoiding triggers is an important part of treatment. You will also need to see a skin specialist (dermatologist) who can develop a treatment plan for you. The goals of treatment are to control your condition and to improve the appearance of your skin. It may take several weeks or months of treatment before you notice an improvement in your skin. Even after your skin improves, you will likely need to continue treatment to prevent your rosacea from coming back. Treatment methods may include:  Using sunscreen or sunblock daily to protect the skin.  Antibiotic medicine, such as metronidazole, applied directly to the skin.  Antibiotics taken by mouth. This is usually prescribed if you have eye problems from your rosacea.  Laser surgery  to improve the appearance of the skin. This surgery can reduce the appearance of red lines on the skin and can remove excess tissue from the nose to reduce its size. HOME CARE INSTRUCTIONS  Avoid things that seem to trigger your flare-ups.  If you are given antibiotics, take them as directed. Finish them even if you start to feel better.  Use a gentle facial cleanser that does not contain alcohol.  You may use a mild facial moisturizer.  Use a sunscreen or sunblock with SPF 30 or greater.  Wear a green-tinted foundation powder to conceal redness, if needed. Choose cosmetics that are noncomedogenic. This means they do not block your pores.  If your eyelids are affected, apply warm compresses to the eyelids. Do this up to 4 times a day or as directed by your caregiver. SEEK MEDICAL CARE IF:  Your skin problems get worse.  You feel depressed.  You lose your appetite.  You have trouble concentrating.  You have problems with your eyes, such as redness or itching. MAKE SURE YOU:  Understand these instructions.  Will watch your condition.  Will get help right away if you are not doing well or get worse. Document Released: 03/18/2004 Document Revised: 08/10/2011 Document Reviewed: 01/19/2011 [facility] Patient Information 2015 [place], [place]. This information is not intended to replace advice given to you by your health care provider. Make sure you discuss any questions you have with your health care provider.

## 2014-11-12 ENCOUNTER — Encounter: Payer: BLUE CROSS/BLUE SHIELD | Admitting: Nurse Practitioner

## 2014-12-17 ENCOUNTER — Encounter: Payer: Self-pay | Admitting: Nurse Practitioner

## 2014-12-17 ENCOUNTER — Ambulatory Visit (INDEPENDENT_AMBULATORY_CARE_PROVIDER_SITE_OTHER): Payer: BLUE CROSS/BLUE SHIELD | Admitting: Nurse Practitioner

## 2014-12-17 ENCOUNTER — Ambulatory Visit (INDEPENDENT_AMBULATORY_CARE_PROVIDER_SITE_OTHER): Payer: BLUE CROSS/BLUE SHIELD

## 2014-12-17 VITALS — BP 117/76 | HR 73 | Temp 97.9°F | Ht 60.0 in | Wt 168.6 lb

## 2014-12-17 DIAGNOSIS — Z23 Encounter for immunization: Secondary | ICD-10-CM

## 2014-12-17 DIAGNOSIS — E039 Hypothyroidism, unspecified: Secondary | ICD-10-CM | POA: Diagnosis not present

## 2014-12-17 DIAGNOSIS — Z0000 Encounter for general adult medical examination without abnormal findings: Secondary | ICD-10-CM | POA: Diagnosis not present

## 2014-12-17 LAB — POCT URINALYSIS DIPSTICK
BILIRUBIN UA: NEGATIVE
GLUCOSE UA: NEGATIVE
KETONES UA: NEGATIVE
LEUKOCYTES UA: NEGATIVE
Nitrite, UA: NEGATIVE
Protein, UA: NEGATIVE
Spec Grav, UA: 1.01
Urobilinogen, UA: NEGATIVE
pH, UA: 6

## 2014-12-17 LAB — POCT UA - MICROSCOPIC ONLY
BACTERIA, U MICROSCOPIC: NEGATIVE
CASTS, UR, LPF, POC: NEGATIVE
CRYSTALS, UR, HPF, POC: NEGATIVE
MUCUS UA: NEGATIVE
WBC, Ur, HPF, POC: NEGATIVE
Yeast, UA: NEGATIVE

## 2014-12-17 MED ORDER — LEVOTHYROXINE SODIUM 50 MCG PO TABS: 50.0000 ug | ORAL_TABLET | Freq: Every day | ORAL | Status: DC

## 2014-12-17 NOTE — Patient Instructions (Signed)
Health Maintenance, Female Adopting a healthy lifestyle and getting preventive care can go a long way to promote health and wellness. Talk with your health care provider about what schedule of regular examinations is right for you. This is a good chance for you to check in with your provider about disease prevention and staying healthy. In between checkups, there are plenty of things you can do on your own. Experts have done a lot of research about which lifestyle changes and preventive measures are most likely to keep you healthy. Ask your health care provider for more information. WEIGHT AND DIET  Eat a healthy diet  Be sure to include plenty of vegetables, fruits, low-fat dairy products, and lean protein.  Do not eat a lot of foods high in solid fats, added sugars, or salt.  Get regular exercise. This is one of the most important things you can do for your health.  Most adults should exercise for at least 150 minutes each week. The exercise should increase your heart rate and make you sweat (moderate-intensity exercise).  Most adults should also do strengthening exercises at least twice a week. This is in addition to the moderate-intensity exercise.  Maintain a healthy weight  Body mass index (BMI) is a measurement that can be used to identify possible weight problems. It estimates body fat based on height and weight. Your health care provider can help determine your BMI and help you achieve or maintain a healthy weight.  For females 20 years of age and older:   A BMI below 18.5 is considered underweight.  A BMI of 18.5 to 24.9 is normal.  A BMI of 25 to 29.9 is considered overweight.  A BMI of 30 and above is considered obese.  Watch levels of cholesterol and blood lipids  You should start having your blood tested for lipids and cholesterol at 49 years of age, then have this test every 5 years.  You may need to have your cholesterol levels checked more often if:  Your lipid  or cholesterol levels are high.  You are older than 50 years of age.  You are at high risk for heart disease.  CANCER SCREENING   Lung Cancer  Lung cancer screening is recommended for adults 55-80 years old who are at high risk for lung cancer because of a history of smoking.  A yearly low-dose CT scan of the lungs is recommended for people who:  Currently smoke.  Have quit within the past 15 years.  Have at least a 30-pack-year history of smoking. A pack year is smoking an average of one pack of cigarettes a day for 1 year.  Yearly screening should continue until it has been 15 years since you quit.  Yearly screening should stop if you develop a health problem that would prevent you from having lung cancer treatment.  Breast Cancer  Practice breast self-awareness. This means understanding how your breasts normally appear and feel.  It also means doing regular breast self-exams. Let your health care provider know about any changes, no matter how small.  If you are in your 20s or 30s, you should have a clinical breast exam (CBE) by a health care provider every 1-3 years as part of a regular health exam.  If you are 40 or older, have a CBE every year. Also consider having a breast X-ray (mammogram) every year.  If you have a family history of breast cancer, talk to your health care provider about genetic screening.  If you   are at high risk for breast cancer, talk to your health care provider about having an MRI and a mammogram every year.  Breast cancer gene (BRCA) assessment is recommended for women who have family members with BRCA-related cancers. BRCA-related cancers include:  Breast.  Ovarian.  Tubal.  Peritoneal cancers.  Results of the assessment will determine the need for genetic counseling and BRCA1 and BRCA2 testing. Cervical Cancer Your health care provider may recommend that you be screened regularly for cancer of the pelvic organs (ovaries, uterus, and  vagina). This screening involves a pelvic examination, including checking for microscopic changes to the surface of your cervix (Pap test). You may be encouraged to have this screening done every 3 years, beginning at age 21.  For women ages 30-65, health care providers may recommend pelvic exams and Pap testing every 3 years, or they may recommend the Pap and pelvic exam, combined with testing for human papilloma virus (HPV), every 5 years. Some types of HPV increase your risk of cervical cancer. Testing for HPV may also be done on women of any age with unclear Pap test results.  Other health care providers may not recommend any screening for nonpregnant women who are considered low risk for pelvic cancer and who do not have symptoms. Ask your health care provider if a screening pelvic exam is right for you.  If you have had past treatment for cervical cancer or a condition that could lead to cancer, you need Pap tests and screening for cancer for at least 20 years after your treatment. If Pap tests have been discontinued, your risk factors (such as having a new sexual partner) need to be reassessed to determine if screening should resume. Some women have medical problems that increase the chance of getting cervical cancer. In these cases, your health care provider may recommend more frequent screening and Pap tests. Colorectal Cancer  This type of cancer can be detected and often prevented.  Routine colorectal cancer screening usually begins at 50 years of age and continues through 49 years of age.  Your health care provider may recommend screening at an earlier age if you have risk factors for colon cancer.  Your health care provider may also recommend using home test kits to check for hidden blood in the stool.  A small camera at the end of a tube can be used to examine your colon directly (sigmoidoscopy or colonoscopy). This is done to check for the earliest forms of colorectal  cancer.  Routine screening usually begins at age 50.  Direct examination of the colon should be repeated every 5-10 years through 49 years of age. However, you may need to be screened more often if early forms of precancerous polyps or small growths are found. Skin Cancer  Check your skin from head to toe regularly.  Tell your health care provider about any new moles or changes in moles, especially if there is a change in a mole's shape or color.  Also tell your health care provider if you have a mole that is larger than the size of a pencil eraser.  Always use sunscreen. Apply sunscreen liberally and repeatedly throughout the day.  Protect yourself by wearing long sleeves, pants, a wide-brimmed hat, and sunglasses whenever you are outside. HEART DISEASE, DIABETES, AND HIGH BLOOD PRESSURE   High blood pressure causes heart disease and increases the risk of stroke. High blood pressure is more likely to develop in:  People who have blood pressure in the high end   of the normal range (130-139/85-89 mm Hg).  People who are overweight or obese.  People who are African American.  If you are 42-64 years of age, have your blood pressure checked every 3-5 years. If you are 84 years of age or older, have your blood pressure checked every year. You should have your blood pressure measured twice--once when you are at a hospital or clinic, and once when you are not at a hospital or clinic. Record the average of the two measurements. To check your blood pressure when you are not at a hospital or clinic, you can use:  An automated blood pressure machine at a pharmacy.  A home blood pressure monitor.  If you are between 6 years and 49 years old, ask your health care provider if you should take aspirin to prevent strokes.  Have regular diabetes screenings. This involves taking a blood sample to check your fasting blood sugar level.  If you are at a normal weight and have a low risk for diabetes,  have this test once every three years after 49 years of age.  If you are overweight and have a high risk for diabetes, consider being tested at a younger age or more often. PREVENTING INFECTION  Hepatitis B  If you have a higher risk for hepatitis B, you should be screened for this virus. You are considered at high risk for hepatitis B if:  You were born in a country where hepatitis B is common. Ask your health care provider which countries are considered high risk.  Your parents were born in a high-risk country, and you have not been immunized against hepatitis B (hepatitis B vaccine).  You have HIV or AIDS.  You use needles to inject street drugs.  You live with someone who has hepatitis B.  You have had sex with someone who has hepatitis B.  You get hemodialysis treatment.  You take certain medicines for conditions, including cancer, organ transplantation, and autoimmune conditions. Hepatitis C  Blood testing is recommended for:  Everyone born from [birth year] through 1965.  Anyone with known risk factors for hepatitis C. Sexually transmitted infections (STIs)  You should be screened for sexually transmitted infections (STIs) including gonorrhea and chlamydia if:  You are sexually active and are younger than 49 years of age.  You are older than 49 years of age and your health care provider tells you that you are at risk for this type of infection.  Your sexual activity has changed since you were last screened and you are at an increased risk for chlamydia or gonorrhea. Ask your health care provider if you are at risk.  If you do not have HIV, but are at risk, it may be recommended that you take a prescription medicine daily to prevent HIV infection. This is called pre-exposure prophylaxis (PrEP). You are considered at risk if:  You are sexually active and do not regularly use condoms or know the HIV status of your partner(s).  You take drugs by injection.  You are sexually  active with a partner who has HIV. Talk with your health care provider about whether you are at high risk of being infected with HIV. If you choose to begin PrEP, you should first be tested for HIV. You should then be tested every 3 months for as long as you are taking PrEP.  PREGNANCY   If you are premenopausal and you may become pregnant, ask your health care provider about preconception counseling.  If you may  become pregnant, take 400 to 800 micrograms (mcg) of folic acid every day.  If you want to prevent pregnancy, talk to your health care provider about birth control (contraception). OSTEOPOROSIS AND MENOPAUSE   Osteoporosis is a disease in which the bones lose minerals and strength with aging. This can result in serious bone fractures. Your risk for osteoporosis can be identified using a bone density scan.  If you are 61 years of age or older, or if you are at risk for osteoporosis and fractures, ask your health care provider if you should be screened.  Ask your health care provider whether you should take a calcium or vitamin D supplement to lower your risk for osteoporosis.  Menopause may have certain physical symptoms and risks.  Hormone replacement therapy may reduce some of these symptoms and risks. Talk to your health care provider about whether hormone replacement therapy is right for you.  HOME CARE INSTRUCTIONS   Schedule regular health, dental, and eye exams.  Stay current with your immunizations.   Do not use any tobacco products including cigarettes, chewing tobacco, or electronic cigarettes.  If you are pregnant, do not drink alcohol.  If you are breastfeeding, limit how much and how often you drink alcohol.  Limit alcohol intake to no more than 1 drink per day for nonpregnant women. One drink equals 12 ounces of beer, 5 ounces of wine, or 1 ounces of hard liquor.  Do not use street drugs.  Do not share needles.  Ask your health care provider for help if  you need support or information about quitting drugs.  Tell your health care provider if you often feel depressed.  Tell your health care provider if you have ever been abused or do not feel safe at home.   This information is not intended to replace advice given to you by your health care provider. Make sure you discuss any questions you have with your health care provider.   Document Released: 08/24/2010 Document Revised: 03/01/2014 Document Reviewed: 01/10/2013 Elsevier Interactive Patient Education [employer].

## 2014-12-17 NOTE — Progress Notes (Signed)
   Subjective:    Patient ID: Kristin Powell, female    DOB: 1965/04/26, 49 y.o.   MRN: [id number]  HPI Patient here today for CPE no PAP. She is doing well today without complaints. Current medical problems include:  Hypothyroidism Currently on levothyroxine 7mcg daily- No c/o of fatigue , hair loss or dry skin.  Vitamin D deficiency Vitamin D OTC daily- no side effects- forgets to take sometimes.   Review of Systems  Constitutional: Negative for appetite change, fatigue and unexpected weight change.  Respiratory: Negative for chest tightness, shortness of breath and wheezing.   Cardiovascular: Negative for chest pain and palpitations.  Musculoskeletal: Negative for back pain and arthralgias.  Neurological: Negative for dizziness and light-headedness.  All other systems reviewed and are negative.      Objective:   Physical Exam  Constitutional: She is oriented to person, place, and time. She appears well-developed and well-nourished.  HENT:  Head: Normocephalic.  Right Ear: External ear normal.  Left Ear: External ear normal.  Mouth/Throat: Oropharynx is clear and moist.  Eyes: Pupils are equal, round, and reactive to light.  Neck: Normal range of motion.  Cardiovascular: Normal rate, regular rhythm, normal heart sounds and intact distal pulses.   Pulmonary/Chest: Effort normal and breath sounds normal.  Abdominal: Soft.  Musculoskeletal: Normal range of motion.  Neurological: She is alert and oriented to person, place, and time. She has normal reflexes.  Skin: Skin is warm and dry.  Psychiatric: She has a normal mood and affect. Her behavior is normal. Thought content normal.      BP 117/76 mmHg  Pulse 73  Temp(Src) 97.9 F (36.6 C) (Oral)  Ht 5' (1.524 m)  Wt 168 lb 9.6 oz (76.476 kg)  BMI 32.93 kg/m2   Chest xray- no cardiopulmonary disease-Preliminary reading by Ronnald Collum, FNP  [facility]   EKG- Kerry Hough, FNP    Assessment & Plan:  1.  Hypothyroidism, unspecified hypothyroidism type Continue with current medication regimen - Thyroid Panel With TSH - levothyroxine (SYNTHROID, LEVOTHROID) 50 MCG tablet; Take 1 tablet (50 mcg total) by mouth daily.  Dispense: 90 tablet; Refill: 2  2. Annual physical exam  - CMP14+EGFR - CBC with Differential/Platelet - Lipid panel - Vit D  25 hydroxy (rtn osteoporosis monitoring) - POCT urinalysis dipstick - POCT UA - Microscopic Only - DG Chest 2 View; Future - EKG 12-Lead  Flu shot Given  Continue all meds Labs pending Health Maintenance reviewed Diet and exercise encouraged RTO 1 year  Mary-Margaret Hassell Done, FNP

## 2014-12-18 DIAGNOSIS — Z23 Encounter for immunization: Secondary | ICD-10-CM | POA: Diagnosis not present

## 2014-12-18 LAB — CMP14+EGFR
ALT: 8 IU/L (ref 0–32)
AST: 16 IU/L (ref 0–40)
Albumin/Globulin Ratio: 1.9 (ref 1.1–2.5)
Albumin: 4.2 g/dL (ref 3.5–5.5)
Alkaline Phosphatase: 49 IU/L (ref 39–117)
BUN / CREAT RATIO: 16 (ref 9–23)
BUN: 11 mg/dL (ref 6–24)
Bilirubin Total: 0.3 mg/dL (ref 0.0–1.2)
CO2: 26 mmol/L (ref 18–29)
CREATININE: 0.7 mg/dL (ref 0.57–1.00)
Calcium: 9.3 mg/dL (ref 8.7–10.2)
Chloride: 101 mmol/L (ref 97–106)
GFR calc Af Amer: 118 mL/min/{1.73_m2} (ref >59)
GFR, EST NON AFRICAN AMERICAN: 103 mL/min/{1.73_m2} (ref >59)
GLUCOSE: 88 mg/dL (ref 65–99)
Globulin, Total: 2.2 g/dL (ref 1.5–4.5)
Potassium: 4.5 mmol/L (ref 3.5–5.2)
Sodium: 140 mmol/L (ref 136–144)
Total Protein: 6.4 g/dL (ref 6.0–8.5)

## 2014-12-18 LAB — CBC WITH DIFFERENTIAL/PLATELET
BASOS: 0 %
Basophils Absolute: 0 10*3/uL (ref 0.0–0.2)
EOS (ABSOLUTE): 0.1 10*3/uL (ref 0.0–0.4)
EOS: 1 %
HEMATOCRIT: 43.2 % (ref 34.0–46.6)
HEMOGLOBIN: 14.2 g/dL (ref 11.1–15.9)
Immature Grans (Abs): 0 10*3/uL (ref 0.0–0.1)
Immature Granulocytes: 0 %
LYMPHS ABS: 1.3 10*3/uL (ref 0.7–3.1)
Lymphs: 25 %
MCH: 30.9 pg (ref 26.6–33.0)
MCHC: 32.9 g/dL (ref 31.5–35.7)
MCV: 94 fL (ref 79–97)
Monocytes Absolute: 0.4 10*3/uL (ref 0.1–0.9)
Monocytes: 7 %
NEUTROS ABS: 3.5 10*3/uL (ref 1.4–7.0)
Neutrophils: 67 %
Platelets: 204 10*3/uL (ref 150–379)
RBC: 4.59 x10E6/uL (ref 3.77–5.28)
RDW: 13 % (ref 12.3–15.4)
WBC: 5.3 10*3/uL (ref 3.4–10.8)

## 2014-12-18 LAB — LIPID PANEL
CHOL/HDL RATIO: 2.1 ratio (ref 0.0–4.4)
Cholesterol, Total: 169 mg/dL (ref 100–199)
HDL: 80 mg/dL (ref >39)
LDL CALC: 78 mg/dL (ref 0–99)
Triglycerides: 53 mg/dL (ref 0–149)
VLDL Cholesterol Cal: 11 mg/dL (ref 5–40)

## 2014-12-18 LAB — THYROID PANEL WITH TSH
Free Thyroxine Index: 1.8 (ref 1.2–4.9)
T3 Uptake Ratio: 27 % (ref 24–39)
T4, Total: 6.5 ug/dL (ref 4.5–12.0)
TSH: 2.32 u[IU]/mL (ref 0.450–4.500)

## 2014-12-18 LAB — VITAMIN D 25 HYDROXY (VIT D DEFICIENCY, FRACTURES): Vit D, 25-Hydroxy: 28.8 ng/mL — ABNORMAL LOW (ref 30.0–100.0)

## 2015-05-05 ENCOUNTER — Ambulatory Visit (INDEPENDENT_AMBULATORY_CARE_PROVIDER_SITE_OTHER): Payer: BLUE CROSS/BLUE SHIELD | Admitting: Nurse Practitioner

## 2015-05-05 ENCOUNTER — Encounter: Payer: Self-pay | Admitting: Nurse Practitioner

## 2015-05-05 VITALS — BP 108/73 | HR 62 | Temp 96.8°F | Ht 60.0 in | Wt 172.0 lb

## 2015-05-05 DIAGNOSIS — Z6833 Body mass index (BMI) 33.0-33.9, adult: Secondary | ICD-10-CM | POA: Diagnosis not present

## 2015-05-05 DIAGNOSIS — E039 Hypothyroidism, unspecified: Secondary | ICD-10-CM | POA: Diagnosis not present

## 2015-05-05 NOTE — Progress Notes (Signed)
   Subjective:    Patient ID: Kristin Powell, female    DOB: 05/04/1965, 50 y.o.   MRN: [id number]  HPI  Patient here today for blood work. She is doing well today without complaints. Current medical problems include:  Hypothyroidism Currently on levothyroxine 66mg daily- No c/o of fatigue , hair loss or dry skin.  Vitamin D deficiency Vitamin D OTC daily- no side effects- forgets to take sometimes.   Review of Systems  Constitutional: Negative.  Negative for appetite change, fatigue and unexpected weight change.  HENT: Negative.   Eyes: Negative.   Respiratory: Negative.  Negative for chest tightness, shortness of breath and wheezing.   Cardiovascular: Negative.  Negative for chest pain and palpitations.  Gastrointestinal: Negative.   Endocrine: Negative.   Genitourinary: Negative.   Musculoskeletal: Negative.  Negative for back pain and arthralgias.  Skin: Negative.   Allergic/Immunologic: Negative.   Neurological: Negative.  Negative for dizziness and light-headedness.  Hematological: Negative.   Psychiatric/Behavioral: Negative.   All other systems reviewed and are negative.      Objective:   Physical Exam  Constitutional: She is oriented to person, place, and time. Vital signs are normal. She appears well-developed and well-nourished.  HENT:  Head: Normocephalic.  Right Ear: Hearing, tympanic membrane and external ear normal.  Left Ear: Hearing, tympanic membrane and external ear normal.  Nose: Nose normal.  Mouth/Throat: Uvula is midline and oropharynx is clear and moist.  Eyes: Conjunctivae, EOM and lids are normal. Pupils are equal, round, and reactive to light.  Neck: Trachea normal, normal range of motion and full passive range of motion without pain.  Cardiovascular: Normal rate, regular rhythm, normal heart sounds, intact distal pulses and normal pulses.   Pulmonary/Chest: Effort normal and breath sounds normal.  Abdominal: Soft.  Musculoskeletal: Normal range  of motion.  Neurological: She is alert and oriented to person, place, and time. She has normal reflexes.  Skin: Skin is warm, dry and intact.  Psychiatric: She has a normal mood and affect. Her behavior is normal. Thought content normal.      BP 108/73 mmHg  Pulse 62  Temp(Src) 96.8 F (36 C) (Oral)  Ht 5' (1.524 m)  Wt 172 lb (78.019 kg)  BMI 33.59 kg/m2        Assessment & Plan:   1. Hypothyroidism, unspecified hypothyroidism type - CMP14+EGFR - Lipid panel - Thyroid Panel With TSH  2. BMI 33.0-33.9,adult Discussed diet and exercise for person with BMI >25 Will recheck weight in 3-6 months    Labs pending Health maintenance reviewed Diet and exercise encouraged Continue all meds Follow up  In 6 months   MPeebles FNP

## 2015-05-05 NOTE — Patient Instructions (Signed)

## 2015-05-06 LAB — CMP14+EGFR
ALT: 13 IU/L (ref 0–32)
AST: 21 IU/L (ref 0–40)
Albumin/Globulin Ratio: 2.2 (ref 1.2–2.2)
Albumin: 4.4 g/dL (ref 3.5–5.5)
Alkaline Phosphatase: 46 IU/L (ref 39–117)
BUN/Creatinine Ratio: 12 (ref 9–23)
BUN: 10 mg/dL (ref 6–24)
Bilirubin Total: 0.6 mg/dL (ref 0.0–1.2)
CALCIUM: 8.9 mg/dL (ref 8.7–10.2)
CO2: 23 mmol/L (ref 18–29)
CREATININE: 0.82 mg/dL (ref 0.57–1.00)
Chloride: 102 mmol/L (ref 96–106)
GFR calc Af Amer: 97 mL/min/{1.73_m2} (ref >59)
GFR, EST NON AFRICAN AMERICAN: 84 mL/min/{1.73_m2} (ref >59)
GLOBULIN, TOTAL: 2 g/dL (ref 1.5–4.5)
GLUCOSE: 91 mg/dL (ref 65–99)
Potassium: 4 mmol/L (ref 3.5–5.2)
Sodium: 140 mmol/L (ref 134–144)
Total Protein: 6.4 g/dL (ref 6.0–8.5)

## 2015-05-06 LAB — LIPID PANEL
CHOL/HDL RATIO: 2.2 ratio (ref 0.0–4.4)
Cholesterol, Total: 168 mg/dL (ref 100–199)
HDL: 77 mg/dL (ref >39)
LDL CALC: 81 mg/dL (ref 0–99)
TRIGLYCERIDES: 51 mg/dL (ref 0–149)
VLDL Cholesterol Cal: 10 mg/dL (ref 5–40)

## 2015-05-06 LAB — THYROID PANEL WITH TSH
Free Thyroxine Index: 2 (ref 1.2–4.9)
T3 Uptake Ratio: 29 % (ref 24–39)
T4, Total: 6.9 ug/dL (ref 4.5–12.0)
TSH: 3.48 u[IU]/mL (ref 0.450–4.500)

## 2015-06-03 ENCOUNTER — Other Ambulatory Visit: Payer: Self-pay

## 2015-06-03 DIAGNOSIS — Z1231 Encounter for screening mammogram for malignant neoplasm of breast: Secondary | ICD-10-CM

## 2015-06-20 ENCOUNTER — Ambulatory Visit
Admission: RE | Admit: 2015-06-20 | Discharge: 2015-06-20 | Disposition: A | Discharge status: Home / Self Care | Payer: BLUE CROSS/BLUE SHIELD | Source: Ambulatory Visit

## 2015-06-20 DIAGNOSIS — Z1231 Encounter for screening mammogram for malignant neoplasm of breast: Secondary | ICD-10-CM

## 2015-07-23 ENCOUNTER — Ambulatory Visit (INDEPENDENT_AMBULATORY_CARE_PROVIDER_SITE_OTHER): Payer: BLUE CROSS/BLUE SHIELD | Admitting: Family Medicine

## 2015-07-23 ENCOUNTER — Encounter: Payer: Self-pay | Admitting: Family Medicine

## 2015-07-23 VITALS — BP 115/74 | HR 77 | Temp 97.8°F | Ht 60.0 in | Wt 166.4 lb

## 2015-07-23 DIAGNOSIS — S161XXA Strain of muscle, fascia and tendon at neck level, initial encounter: Secondary | ICD-10-CM
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
## 2015-09-08 DIAGNOSIS — Z719 Counseling, unspecified: Secondary | ICD-10-CM | POA: Diagnosis not present

## 2015-09-08 DIAGNOSIS — Z008 Encounter for other general examination: Secondary | ICD-10-CM | POA: Diagnosis not present

## 2015-09-08 DIAGNOSIS — E559 Vitamin D deficiency, unspecified: Secondary | ICD-10-CM | POA: Diagnosis not present

## 2015-09-08 DIAGNOSIS — E039 Hypothyroidism, unspecified: Secondary | ICD-10-CM | POA: Diagnosis not present

## 2015-12-05 ENCOUNTER — Ambulatory Visit (INDEPENDENT_AMBULATORY_CARE_PROVIDER_SITE_OTHER): Payer: BLUE CROSS/BLUE SHIELD

## 2015-12-05 DIAGNOSIS — Z23 Encounter for immunization: Secondary | ICD-10-CM | POA: Diagnosis not present

## 2016-01-07 DIAGNOSIS — E559 Vitamin D deficiency, unspecified: Secondary | ICD-10-CM | POA: Diagnosis not present

## 2016-01-07 DIAGNOSIS — E039 Hypothyroidism, unspecified: Secondary | ICD-10-CM | POA: Diagnosis not present

## 2016-01-21 DIAGNOSIS — E559 Vitamin D deficiency, unspecified: Secondary | ICD-10-CM | POA: Diagnosis not present

## 2016-01-21 DIAGNOSIS — E039 Hypothyroidism, unspecified: Secondary | ICD-10-CM | POA: Diagnosis not present

## 2016-05-20 ENCOUNTER — Ambulatory Visit: Payer: BLUE CROSS/BLUE SHIELD | Admitting: Nurse Practitioner

## 2016-05-31 ENCOUNTER — Encounter: Payer: Self-pay | Admitting: Nurse Practitioner

## 2016-05-31 ENCOUNTER — Ambulatory Visit (INDEPENDENT_AMBULATORY_CARE_PROVIDER_SITE_OTHER): Payer: BLUE CROSS/BLUE SHIELD | Admitting: Nurse Practitioner

## 2016-05-31 VITALS — BP 106/70 | HR 62 | Temp 97.0°F | Ht 60.0 in | Wt 158.0 lb

## 2016-05-31 DIAGNOSIS — E039 Hypothyroidism, unspecified: Secondary | ICD-10-CM | POA: Diagnosis not present

## 2016-05-31 DIAGNOSIS — F329 Major depressive disorder, single episode, unspecified: Secondary | ICD-10-CM

## 2016-05-31 MED ORDER — LEVOTHYROXINE SODIUM 50 MCG PO TABS: 50.0000 ug | ORAL_TABLET | Freq: Every day | ORAL | 2 refills | Status: DC

## 2016-05-31 NOTE — Progress Notes (Signed)
   Subjective:    Patient ID: Kristin Powell, female    DOB: [birth year], 51 y.o.   MRN: [id number]  HPI  Patient comes in today for recheck of hypothyroidism. She is currently on levothyroxine 18mg daily. She says as of late that she seems to be very fatigued. Does not feel like doing anything. Denies depression even though her depression screen scored a 10 today. [facility] 12/17/2014  Decreased Interest 3 0  Down, Depressed, Hopeless 1 0  PHQ - 2 Score 4 0  Altered sleeping 2 -  Tired, decreased energy 3 -  Change in appetite 0 -  Feeling bad or failure about yourself  1 -  Trouble concentrating 0 -  Moving slowly or fidgety/restless 0 -  Suicidal thoughts 0 -  PHQ-9 Score 10 -      Review of Systems  Constitutional: Negative.   HENT: Negative.   Respiratory: Negative.   Cardiovascular: Negative.   Gastrointestinal: Negative.   Genitourinary: Negative.   Neurological: Negative.   Psychiatric/Behavioral: Negative.   All other systems reviewed and are negative.      Objective:   Physical Exam  Constitutional: She is oriented to person, place, and time. She appears well-developed and well-nourished.  HENT:  Nose: Nose normal.  Mouth/Throat: Oropharynx is clear and moist.  Eyes: EOM are normal.  Neck: Trachea normal, normal range of motion and full passive range of motion without pain. Neck supple. No JVD present. Carotid bruit is not present. No thyromegaly present.  Cardiovascular: Normal rate, regular rhythm, normal heart sounds and intact distal pulses.  Exam reveals no gallop and no friction rub.   No murmur heard. Pulmonary/Chest: Effort normal and breath sounds normal.  Abdominal: Soft. Bowel sounds are normal. She exhibits no distension and no mass. There is no tenderness.  Musculoskeletal: Normal range of motion.  Lymphadenopathy:    She has no cervical adenopathy.  Neurological: She is alert and oriented to person, place, and time. She  has normal reflexes.  Skin: Skin is warm and dry.  Psychiatric: She has a normal mood and affect. Her behavior is normal. Judgment and thought content normal.   BP 106/70   Pulse 62   Temp 97 F (36.1 C) (Oral)   Ht 5' (1.524 m)   Wt 158 lb (71.7 kg)   BMI 30.86 kg/m      Assessment & Plan:  1. Hypothyroidism, unspecified type Labs pending - CMP14+EGFR - Lipid panel - Thyroid Panel With TSH - levothyroxine (SYNTHROID, LEVOTHROID) 50 MCG tablet; Take 1 tablet (50 mcg total) by mouth daily.  Dispense: 90 tablet; Refill: 2  2. Reactive depression Reactive to work stressors Patient does not want to start on medication Stress management handout given Patient agreed to RTO is she feels like she is worsening  Mary-Margaret MHassell Done FNP

## 2016-05-31 NOTE — Patient Instructions (Signed)
Stress and Stress Management Stress is a normal reaction to life events. It is what you feel when life demands more than you are used to or more than you can handle. Some stress can be useful. For example, the stress reaction can help you catch the last bus of the day, study for a test, or meet a deadline at work. But stress that occurs too often or for too long can cause problems. It can affect your emotional health and interfere with relationships and normal daily activities. Too much stress can weaken your immune system and increase your risk for physical illness. If you already have a medical problem, stress can make it worse. What are the causes? All sorts of life events may cause stress. An event that causes stress for one person may not be stressful for another person. Major life events commonly cause stress. These may be positive or negative. Examples include losing your job, moving into a new home, getting married, having a baby, or losing a loved one. Less obvious life events may also cause stress, especially if they occur day after day or in combination. Examples include working long hours, driving in traffic, caring for children, being in debt, or being in a difficult relationship. What are the signs or symptoms? Stress may cause emotional symptoms including, the following:  Anxiety. This is feeling worried, afraid, on edge, overwhelmed, or out of control.  Anger. This is feeling irritated or impatient.  Depression. This is feeling sad, down, helpless, or guilty.  Difficulty focusing, remembering, or making decisions. Stress may cause physical symptoms, including the following:  Aches and pains. These may affect your head, neck, back, stomach, or other areas of your body.  Tight muscles or clenched jaw.  Low energy or trouble sleeping. Stress may cause unhealthy behaviors, including the following:  Eating to feel better (overeating) or skipping meals.  Sleeping too little, too  much, or both.  Working too much or putting off tasks (procrastination).  Smoking, drinking alcohol, or using drugs to feel better. How is this diagnosed? Stress is diagnosed through an assessment by your health care provider. Your health care provider will ask questions about your symptoms and any stressful life events.Your health care provider will also ask about your medical history and may order blood tests or other tests. Certain medical conditions and medicine can cause physical symptoms similar to stress. Mental illness can cause emotional symptoms and unhealthy behaviors similar to stress. Your health care provider may refer you to a mental health professional for further evaluation. How is this treated? Stress management is the recommended treatment for stress.The goals of stress management are reducing stressful life events and coping with stress in healthy ways. Techniques for reducing stressful life events include the following:  Stress identification. Self-monitor for stress and identify what causes stress for you. These skills may help you to avoid some stressful events.  Time management. Set your priorities, keep a calendar of events, and learn to say "no." These tools can help you avoid making too many commitments. Techniques for coping with stress include the following:  Rethinking the problem. Try to think realistically about stressful events rather than ignoring them or overreacting. Try to find the positives in a stressful situation rather than focusing on the negatives.  Exercise. Physical exercise can release both physical and emotional tension. The key is to find a form of exercise you enjoy and do it regularly.  Relaxation techniques. These relax the body and mind. Examples include yoga,  meditation, tai chi, biofeedback, deep breathing, progressive muscle relaxation, listening to music, being out in nature, journaling, and other hobbies. Again, the key is to find one or  more that you enjoy and can do regularly.  Healthy lifestyle. Eat a balanced diet, get plenty of sleep, and do not smoke. Avoid using alcohol or drugs to relax.  Strong support network. Spend time with family, friends, or other people you enjoy being around.Express your feelings and talk things over with someone you trust. Counseling or talktherapy with a mental health professional may be helpful if you are having difficulty managing stress on your own. Medicine is typically not recommended for the treatment of stress.Talk to your health care provider if you think you need medicine for symptoms of stress. Follow these instructions at home:  Keep all follow-up visits as directed by your health care provider.  Take all medicines as directed by your health care provider. Contact a health care provider if:  Your symptoms get worse or you start having new symptoms.  You feel overwhelmed by your problems and can no longer manage them on your own. Get help right away if:  You feel like hurting yourself or someone else. This information is not intended to replace advice given to you by your health care provider. Make sure you discuss any questions you have with your health care provider. Document Released: 08/04/2000 Document Revised: 07/17/2015 Document Reviewed: 10/03/2012 Elsevier Interactive Patient Education  2017 [employer].

## 2016-06-01 LAB — LIPID PANEL
Chol/HDL Ratio: 2.2 ratio (ref 0.0–4.4)
Cholesterol, Total: 188 mg/dL (ref 100–199)
HDL: 85 mg/dL (ref >39)
LDL Calculated: 90 mg/dL (ref 0–99)
Triglycerides: 64 mg/dL (ref 0–149)
VLDL CHOLESTEROL CAL: 13 mg/dL (ref 5–40)

## 2016-06-01 LAB — THYROID PANEL WITH TSH
FREE THYROXINE INDEX: 2.2 (ref 1.2–4.9)
T3 Uptake Ratio: 29 % (ref 24–39)
T4 TOTAL: 7.5 ug/dL (ref 4.5–12.0)
TSH: 3.2 u[IU]/mL (ref 0.450–4.500)

## 2016-06-01 LAB — CMP14+EGFR
A/G RATIO: 2 (ref 1.2–2.2)
ALBUMIN: 4.5 g/dL (ref 3.5–5.5)
ALT: 13 IU/L (ref 0–32)
AST: 18 IU/L (ref 0–40)
Alkaline Phosphatase: 53 IU/L (ref 39–117)
BILIRUBIN TOTAL: 0.5 mg/dL (ref 0.0–1.2)
BUN/Creatinine Ratio: 18 (ref 9–23)
BUN: 12 mg/dL (ref 6–24)
CHLORIDE: 100 mmol/L (ref 96–106)
CO2: 24 mmol/L (ref 18–29)
Calcium: 9.1 mg/dL (ref 8.7–10.2)
Creatinine, Ser: 0.68 mg/dL (ref 0.57–1.00)
GFR calc non Af Amer: 102 mL/min/{1.73_m2} (ref >59)
GFR, EST AFRICAN AMERICAN: 118 mL/min/{1.73_m2} (ref >59)
Globulin, Total: 2.2 g/dL (ref 1.5–4.5)
Glucose: 82 mg/dL (ref 65–99)
Potassium: 5.3 mmol/L — ABNORMAL HIGH (ref 3.5–5.2)
Sodium: 140 mmol/L (ref 134–144)
TOTAL PROTEIN: 6.7 g/dL (ref 6.0–8.5)

## 2016-06-25 ENCOUNTER — Other Ambulatory Visit: Payer: Self-pay | Admitting: Nurse Practitioner

## 2016-06-25 DIAGNOSIS — Z1231 Encounter for screening mammogram for malignant neoplasm of breast: Secondary | ICD-10-CM

## 2016-07-27 ENCOUNTER — Ambulatory Visit: Payer: BLUE CROSS/BLUE SHIELD | Admitting: Nurse Practitioner

## 2016-08-05 ENCOUNTER — Ambulatory Visit
Admission: RE | Admit: 2016-08-05 | Discharge: 2016-08-05 | Disposition: A | Discharge status: Home / Self Care | Payer: BLUE CROSS/BLUE SHIELD | Source: Ambulatory Visit | Attending: Nurse Practitioner | Admitting: Nurse Practitioner

## 2016-08-05 DIAGNOSIS — Z1231 Encounter for screening mammogram for malignant neoplasm of breast: Secondary | ICD-10-CM

## 2016-08-09 DIAGNOSIS — Z1389 Encounter for screening for other disorder: Secondary | ICD-10-CM | POA: Diagnosis not present

## 2016-08-09 DIAGNOSIS — E669 Obesity, unspecified: Secondary | ICD-10-CM | POA: Diagnosis not present

## 2016-08-09 DIAGNOSIS — Z719 Counseling, unspecified: Secondary | ICD-10-CM | POA: Diagnosis not present

## 2016-08-09 DIAGNOSIS — Z6834 Body mass index (BMI) 34.0-34.9, adult: Secondary | ICD-10-CM | POA: Diagnosis not present

## 2016-08-09 DIAGNOSIS — E039 Hypothyroidism, unspecified: Secondary | ICD-10-CM | POA: Diagnosis not present

## 2016-08-09 DIAGNOSIS — Z008 Encounter for other general examination: Secondary | ICD-10-CM | POA: Diagnosis not present

## 2016-08-23 ENCOUNTER — Ambulatory Visit (INDEPENDENT_AMBULATORY_CARE_PROVIDER_SITE_OTHER): Payer: BLUE CROSS/BLUE SHIELD | Admitting: Nurse Practitioner

## 2016-08-23 ENCOUNTER — Encounter: Payer: Self-pay | Admitting: Nurse Practitioner

## 2016-08-23 VITALS — BP 108/73 | HR 72 | Temp 97.5°F | Ht 60.0 in | Wt 160.0 lb

## 2016-08-23 DIAGNOSIS — D229 Melanocytic nevi, unspecified: Secondary | ICD-10-CM | POA: Diagnosis not present

## 2016-08-23 NOTE — Progress Notes (Signed)
   Subjective:    Patient ID: Kristin Powell, female    DOB: 03-09-1965, 51 y.o.   MRN: [id number]  HPI  Patient comes in today for removal of mole from left thigh. Says it itches alot   Review of Systems  Constitutional: Negative.   Respiratory: Negative.   Cardiovascular: Negative.   Psychiatric/Behavioral: Negative.   All other systems reviewed and are negative.      Objective:   Physical Exam  Constitutional: She is oriented to person, place, and time. She appears well-developed and well-nourished. No distress.  Cardiovascular: Normal rate.   Pulmonary/Chest: Effort normal.  Neurological: She is alert and oriented to person, place, and time. She has normal reflexes.  Skin: Skin is warm and dry.  2cm slightly raised annular brownish colored leson to left thigh  Psychiatric: She has a normal mood and affect. Her behavior is normal. Judgment and thought content normal.   BP 108/73   Pulse 72   Temp 97.5 F (36.4 C) (Oral)   Ht 5' (1.524 m)   Wt 160 lb (72.6 kg)   BMI 31.25 kg/m   PROCEDURE  Consent:    Consent obtained:  vrbal   Consent given by:  patient   Risks discussed:  possible infection   Alternatives discussed:   freezing or just watch Wound    Type:  mole   Size:  2cm   Location: left thigh Pre-procedure details:    Skin preparation:  betadine Anesthesia     Anesthesia method:  local   Local anesthetic: lidocaine 2% with epi- 95ml Procedure type:    Complexity:  simple Procedure details:    Incision types:  puncture   Scalpel blade:  none   Wound management:  3-0 ethilon simple interrupted stitches   Drainage appearance:  none   Drainage amount:  none   Wound treatment:  n/a   Packing materials: n/a Post-procedure details:    Dressing: 4x4 and tegaderm   Patient tolerance of procedure:  well        Assessment & Plan:  1. Benign mole Keep clean and dry Watch for signs of infection RTO in 10 days stitch removal  Mary-Margaret Hassell Done,  FNP

## 2016-08-23 NOTE — Patient Instructions (Signed)
How to Change Your Dressing A dressing is a material that is placed in and over wounds. A dressing helps your wound to heal by protecting it from:  Bacteria.  Worse injury.  Being too dry or too wet.  What are the risks? The sticky (adhesive) tape that is used with a dressing may make your skin sore or irritated, or it may cause a rash. These are the most common problems. However, more serious problems can develop, such as:  Bleeding.  Infection.  How to change your dressing Getting Ready to Change Your Dressing   Take a shower before you do the first dressing change of the day. If your doctor does not want your wound to get wet and your dressing is not waterproof, you may need to put plastic leak-proof sealing wrap on your dressing to protect it.  If needed, take pain medicine as told by your doctor 30 minutes before you change your dressing.  Set up a clean station for wound care. You will need: ? A plastic trash bag that is open and ready to use. ? Hand sanitizer. ? Wound cleanser or salt-water solution (saline) as told by your doctor. ? New dressing material or bandages. Make sure to open the dressing package so the dressing stays on the inside of the package. You may also need these supplies in your clean station:  A box of vinyl gloves.  Tape.  Skin protectant. This may be a wipe, film, or spray.  Clean or germ-free (sterile) scissors.  A cotton-tipped applicator.  Taking Off Your Old Dressing  Wash your hands with soap and water. Dry your hands with a clean towel. If you cannot use soap and water, use hand sanitizer.  If you are using gloves, put on the gloves before you take off the dressing.  Gently take off any adhesive or tape by pulling it off in the direction of your hair growth. Only touch the outside edges of the dressing.  Take off the dressing. If the dressing sticks to your skin, wet the dressing with a germ-free salt-water solution. This helps it  come off more easily.  Take off any gauze or packing in your wound.  Throw the old dressing supplies into the ready trash bag.  Take off your gloves. To take off each glove, grab the cuff with your other hand and turn the glove inside out. Put the gloves in the trash right away.  Wash your hands with soap and water. Dry your hands with a clean towel. If you cannot use soap and water, use hand sanitizer. Cleaning Your Wound  Follow instructions from your doctor about how to clean your wound. This may include using a salt-water solution or recommended wound cleanser.  Do not use over-the-counter medicated or antiseptic creams, sprays, liquids, or dressings unless your doctor tells you to do that.  Use a clean gauze pad to clean the area fully with the salt-water solution or wound cleanser that your doctor recommends.  Throw the gauze pad into the trash bag.  Wash your hands with soap and water. Dry your hands with a clean towel. If you cannot use soap and water, use hand sanitizer. Putting on the Dressing  If your doctor recommended a skin protectant, put it on the skin around the wound.  Cover the wound with the recommended dressing, such as a nonstick gauze or bandage. Make sure to touch only the outside edges of the dressing. Do not touch the inside of the dressing.    Attach the dressing so all sides stay in place. You may do this with the attached medical adhesive, roll gauze, or tape. If you use tape, do not wrap the tape all the way around your arm or leg.  Take off your gloves. Put them in the trash bag with the old dressing. Tie the bag shut and throw it away.  Wash your hands with soap and water. Dry your hands with a clean towel. If you cannot use soap and water, use hand sanitizer. Get help if:   You have new pain.  You have irritation, a rash, or itching around the wound or dressing.  Changing your dressing is painful.  Changing your dressing causes a lot of  bleeding. Get help right away if:  You have very bad pain.  You have signs of infection, such as: ? More redness, swelling, or pain. ? More fluid or blood. ? Warmth. ? Pus or a bad smell. ? Red streaks leading from wound. ? A fever. This information is not intended to replace advice given to you by your health care provider. Make sure you discuss any questions you have with your health care provider. Document Released: 05/07/2008 Document Revised: 07/17/2015 Document Reviewed: 11/14/2014 Elsevier Interactive Patient Education  2018 [employer] Inc[employer]  

## 2016-09-02 ENCOUNTER — Encounter: Payer: Self-pay | Admitting: Nurse Practitioner

## 2016-09-02 ENCOUNTER — Ambulatory Visit (INDEPENDENT_AMBULATORY_CARE_PROVIDER_SITE_OTHER): Payer: BLUE CROSS/BLUE SHIELD | Admitting: Nurse Practitioner

## 2016-09-02 VITALS — BP 114/73 | HR 59 | Temp 97.4°F | Ht 60.0 in | Wt 159.0 lb

## 2016-09-02 DIAGNOSIS — Z4802 Encounter for removal of sutures: Secondary | ICD-10-CM

## 2016-09-02 NOTE — Patient Instructions (Signed)
Sterile Tape Wound Care °Some cuts and wounds can be closed using sterile tape, also called skin adhesive strips. Skin adhesive strips can be used for shallow (superficial) and simple cuts, wounds, lacerations, and some surgical incisions. These strips act in place of stitches, or in addition to stitches, to hold the edges of the wound together to allow for better healing. Unlike stitches, the adhesive strips do not require needles or anesthetic medicine for placement. The strips usually fall off on their own as the wound is healing. It is important to take proper care of your wound at home while it heals. °How to care for a sterile tape wound °· Try to keep the area around your wound clean and dry. Do not allow the adhesive strips to get wet for the first 12 hours. °· Do not use any soaps or ointments on the wound for the first 12 hours. °· If a bandage (dressing) has been applied, keep it dry. °· Follow instructions from your health care provider about how often to change the dressing. °¨ Wash your hands with soap and water before you change your dressing. If soap and water are not available, use hand sanitizer. °¨ Change your dressing as told by your health care provider. °¨ Leave adhesive strips in place. These skin closures may need to stay in place for 2 weeks or longer. If adhesive strip edges start to loosen and curl up, you may trim the loose edges. Do not remove adhesive strips completely unless your health care provider tells you to do that. °· Do not scratch, rub, or pick at the wound area. °· Protect the wound from further injury until it is healed. °· Protect the wound from sun and tanning bed exposure while it is healing, and for several weeks after healing. °· Check the wound every day for signs of infection. Check for: °¨ More redness, swelling, or pain. °¨ More fluid or blood. °¨ Warmth. °¨ Pus or a bad smell. °Follow these instructions at home: °· Take over-the-counter and prescription medicines  only as told by your health care provider. °· Keep all follow-up visits as told by your health care provider. This is important. °Contact a health care provider if: °· Your adhesive strips become soaked with blood or fall off before the wound has healed. The tape will need to be replaced. °· You have a fever. °Get help right away if: °· You have chills. °· You develop a rash after the strips are applied. °· You have a red streak that goes away from the wound. °· You have more redness, swelling, or pain around your wound. °· You have more fluid or blood coming from your wound. °· Your wound feels warm to the touch. °· You have pus or a bad smell coming from your wound. °· Your wound breaks open. °This information is not intended to replace advice given to you by your health care provider. Make sure you discuss any questions you have with your health care provider. °Document Released: 03/18/2004 Document Revised: 01/02/2016 Document Reviewed: 01/02/2016 °Elsevier Interactive Patient Education © 2017 Else[employer] Inc. ° °

## 2016-09-02 NOTE — Progress Notes (Signed)
   Subjective:    Patient ID: Kristin Powell, female    DOB: 06-19-1965, 51 y.o.   MRN: [id number]  HPI Patient was seen on 08/23/16 for removal of benign lesion from left thigh. SHe comes I today for stitch removal. SHe says area is doing well. No drainage that sheis awre of.   Review of Systems  Constitutional: Negative.   Respiratory: Negative.   Cardiovascular: Negative.   Neurological: Negative.   Psychiatric/Behavioral: Negative.   All other systems reviewed and are negative.      Objective:   Physical Exam  Constitutional: She is oriented to person, place, and time. She appears well-developed and well-nourished. No distress.  Cardiovascular: Normal rate.   Pulmonary/Chest: Effort normal.  Neurological: She is alert and oriented to person, place, and time.  Skin: Skin is warm.  Wound edges well approximated- no erythema , edema or drainage   BP 114/73   Pulse (!) 59   Temp (!) 97.4 F (36.3 C) (Oral)   Ht 5' (1.524 m)   Wt 159 lb (72.1 kg)   BMI 31.05 kg/m   Sutures removed and steri strips applied    Assessment & Plan:   1. Visit for suture removal    Steri strip care discussed Follow up for routine care  [place], FNP

## 2017-02-11 DIAGNOSIS — Z008 Encounter for other general examination: Secondary | ICD-10-CM | POA: Diagnosis not present

## 2017-02-11 DIAGNOSIS — Z6831 Body mass index (BMI) 31.0-31.9, adult: Secondary | ICD-10-CM | POA: Diagnosis not present

## 2017-05-26 ENCOUNTER — Other Ambulatory Visit: Payer: BLUE CROSS/BLUE SHIELD | Admitting: Nurse Practitioner

## 2017-06-03 DIAGNOSIS — J189 Pneumonia, unspecified organism: Secondary | ICD-10-CM | POA: Diagnosis not present

## 2017-06-14 ENCOUNTER — Other Ambulatory Visit: Payer: Self-pay | Admitting: Nurse Practitioner

## 2017-06-14 DIAGNOSIS — E039 Hypothyroidism, unspecified: Secondary | ICD-10-CM

## 2017-06-14 NOTE — Telephone Encounter (Signed)
OV 06/23/17

## 2017-06-17 DIAGNOSIS — Z6831 Body mass index (BMI) 31.0-31.9, adult: Secondary | ICD-10-CM | POA: Diagnosis not present

## 2017-06-23 ENCOUNTER — Encounter: Payer: Self-pay | Admitting: Nurse Practitioner

## 2017-06-23 ENCOUNTER — Ambulatory Visit (INDEPENDENT_AMBULATORY_CARE_PROVIDER_SITE_OTHER): Payer: BLUE CROSS/BLUE SHIELD | Admitting: Nurse Practitioner

## 2017-06-23 VITALS — BP 99/67 | HR 62 | Temp 97.3°F | Ht 60.0 in | Wt 162.0 lb

## 2017-06-23 DIAGNOSIS — D259 Leiomyoma of uterus, unspecified: Secondary | ICD-10-CM | POA: Insufficient documentation

## 2017-06-23 DIAGNOSIS — Z1212 Encounter for screening for malignant neoplasm of rectum: Secondary | ICD-10-CM

## 2017-06-23 DIAGNOSIS — E039 Hypothyroidism, unspecified: Secondary | ICD-10-CM

## 2017-06-23 DIAGNOSIS — Z0000 Encounter for general adult medical examination without abnormal findings: Secondary | ICD-10-CM | POA: Diagnosis not present

## 2017-06-23 DIAGNOSIS — F419 Anxiety disorder, unspecified: Secondary | ICD-10-CM | POA: Insufficient documentation

## 2017-06-23 DIAGNOSIS — Z1211 Encounter for screening for malignant neoplasm of colon: Secondary | ICD-10-CM

## 2017-06-23 DIAGNOSIS — Z01419 Encounter for gynecological examination (general) (routine) without abnormal findings: Secondary | ICD-10-CM

## 2017-06-23 LAB — URINALYSIS, COMPLETE
BILIRUBIN UA: NEGATIVE
Glucose, UA: NEGATIVE
KETONES UA: NEGATIVE
Leukocytes, UA: NEGATIVE
NITRITE UA: NEGATIVE
PROTEIN UA: NEGATIVE
RBC, UA: NEGATIVE
UUROB: 0.2 mg/dL (ref 0.2–1.0)
pH, UA: 5.5 (ref 5.0–7.5)

## 2017-06-23 LAB — MICROSCOPIC EXAMINATION
Bacteria, UA: NONE SEEN
RBC MICROSCOPIC, UA: NONE SEEN /HPF (ref 0–2)
Renal Epithel, UA: NONE SEEN /hpf

## 2017-06-23 MED ORDER — ESCITALOPRAM OXALATE 10 MG PO TABS: 10.0000 mg | ORAL_TABLET | Freq: Every day | ORAL | 1 refills | Status: DC

## 2017-06-23 MED ORDER — LEVOTHYROXINE SODIUM 50 MCG PO TABS: 50.0000 ug | ORAL_TABLET | Freq: Every day | ORAL | 1 refills | Status: DC

## 2017-06-23 NOTE — Progress Notes (Signed)
Subjective:    Patient ID: Kristin Powell, female    DOB: [birth year], 52 y.o.   MRN: [id number]   Chief Complaint: Annual Exam   HPI:  1. Annual physical exam   2. Gynecologic exam normal  Last pap was 06/06/14 which was normal  3. Acquired hypothyroidism  No problems that she is aware of.    Outpatient Encounter Medications as of 06/23/2017  Medication Sig  . Cholecalciferol (VITAMIN D PO) Take by mouth.  . lactobacillus acidophilus (BACID) TABS tablet Take 2 tablets by mouth daily.  Marland Kitchen levothyroxine (SYNTHROID, LEVOTHROID) 50 MCG tablet TAKE 1 TABLET BY MOUTH ONCE DAILY      New complaints: Patient c/o of what she describes as anxiety. She gets very nervous easily. Not sure what causes it but she says she gets very anxious and cannot relax. Has been worsening.  Social history: Lives by  Herself. Works at [employer]  Constitutional: Negative for activity change and appetite change.  HENT: Negative.   Eyes: Negative for pain.  Respiratory: Negative for shortness of breath.   Cardiovascular: Negative for chest pain, palpitations and leg swelling.  Gastrointestinal: Negative for abdominal pain.  Endocrine: Negative for polydipsia.  Genitourinary: Negative.   Skin: Negative for rash.  Neurological: Negative for dizziness, weakness and headaches.  Hematological: Does not bruise/bleed easily.  Psychiatric/Behavioral: Negative.   All other systems reviewed and are negative.      Objective:   Physical Exam  Constitutional: She is oriented to person, place, and time.  HENT:  Head: Normocephalic.  Nose: Nose normal.  Mouth/Throat: Oropharynx is clear and moist.  Eyes: Pupils are equal, round, and reactive to light. EOM are normal.  Neck: Normal range of motion. Neck supple. No JVD present. Carotid bruit is not present.  Cardiovascular: Normal rate, regular rhythm, normal heart sounds and intact distal pulses.  Pulmonary/Chest: Effort normal and breath  sounds normal. No respiratory distress. She has no wheezes. She has no rales. She exhibits no tenderness.  Abdominal: Soft. Normal appearance, normal aorta and bowel sounds are normal. She exhibits no distension, no abdominal bruit, no pulsatile midline mass and no mass. There is no splenomegaly or hepatomegaly. There is no tenderness.  Genitourinary: Vagina normal and uterus normal. No vaginal discharge found.  Genitourinary Comments: Cervix parous and pink Large fibroid palpable on right side of uterus- nontender  Musculoskeletal: Normal range of motion. She exhibits no edema.  Lymphadenopathy:    She has no cervical adenopathy.  Neurological: She is alert and oriented to person, place, and time. She has normal reflexes.  Skin: Skin is warm and dry.  Psychiatric: She has a normal mood and affect. Her behavior is normal. Judgment and thought content normal.    BP 99/67   Pulse 62   Temp (!) 97.3 F (36.3 C) (Oral)   Ht 5' (1.524 m)   Wt 162 lb (73.5 kg)   BMI 31.64 kg/m        Assessment & Plan:   Kristin Powell comes in today with chief complaint of Annual Exam   Diagnosis and orders addressed:  1. Annual physical exam - Urinalysis, Complete - CBC with Differential/Platelet - CMP14+EGFR - Lipid panel  2. Gynecologic exam normal - IGP, Aptima HPV, rfx 16/18,45  3. Acquired hypothyroidism - levothyroxine (SYNTHROID, LEVOTHROID) 50 MCG tablet; Take 1 tablet (50 mcg total) by mouth daily.  Dispense: 90 tablet; Refill: 1 - Thyroid Panel With TSH   4. Uterine  leiomyoma, unspecified location Want here to have U/S to make sure it is uterine fibroid and not anything behind it. - US Pelvis Complete; Future  5. Anxiety -medication side effects discussed Follow up in 1-2 months - lexapro 46m 1 po qd #90 1 refill  Ordered cologuard Labs pending Health Maintenance reviewed Diet and exercise encouraged  Follow up plan: 6 months   MAsh Flat FNP

## 2017-06-23 NOTE — Patient Instructions (Signed)
   Your doctor has prescribed Cologuard, an easy-to-use, noninvasive test for colon cancer screening, based on the latest advances in stool DNA science.   Here's what will happen next:  1. You may receive a call or email from [facility] to confirm your mailing address and insurance information 2. Your kit will be shipped directly to you 3. You collet your stool sample in the privacy of your own home 4. You return the kit via UPS prepaid shipping or pick-up, in the same box it arrived in 5. You doctor will contact you with the results once they are available  Screening for colon cancer is very important to your good health, so if you have any questions at all, please call [facility] Customer Support Specialists at [phone]. They are available 24 hours a day, 6 days a week.    

## 2017-06-23 NOTE — Addendum Note (Signed)
Addended by: Chevis Pretty on: 06/23/2017 12:27 PM   Modules accepted: Orders

## 2017-06-24 LAB — CMP14+EGFR
A/G RATIO: 2 (ref 1.2–2.2)
ALK PHOS: 54 IU/L (ref 39–117)
ALT: 13 IU/L (ref 0–32)
AST: 18 IU/L (ref 0–40)
Albumin: 4.1 g/dL (ref 3.5–5.5)
BUN/Creatinine Ratio: 15 (ref 9–23)
BUN: 10 mg/dL (ref 6–24)
Bilirubin Total: 0.3 mg/dL (ref 0.0–1.2)
CALCIUM: 8.8 mg/dL (ref 8.7–10.2)
CHLORIDE: 102 mmol/L (ref 96–106)
CO2: 24 mmol/L (ref 20–29)
Creatinine, Ser: 0.66 mg/dL (ref 0.57–1.00)
GFR calc Af Amer: 118 mL/min/{1.73_m2} (ref >59)
GFR calc non Af Amer: 103 mL/min/{1.73_m2} (ref >59)
GLOBULIN, TOTAL: 2.1 g/dL (ref 1.5–4.5)
Glucose: 100 mg/dL — ABNORMAL HIGH (ref 65–99)
POTASSIUM: 4.3 mmol/L (ref 3.5–5.2)
SODIUM: 139 mmol/L (ref 134–144)
Total Protein: 6.2 g/dL (ref 6.0–8.5)

## 2017-06-24 LAB — CBC WITH DIFFERENTIAL/PLATELET
Basophils Absolute: 0 10*3/uL (ref 0.0–0.2)
Basos: 1 %
EOS (ABSOLUTE): 0.1 10*3/uL (ref 0.0–0.4)
Eos: 2 %
HEMATOCRIT: 41.5 % (ref 34.0–46.6)
Hemoglobin: 13.2 g/dL (ref 11.1–15.9)
IMMATURE GRANULOCYTES: 0 %
Immature Grans (Abs): 0 10*3/uL (ref 0.0–0.1)
LYMPHS ABS: 1.2 10*3/uL (ref 0.7–3.1)
Lymphs: 27 %
MCH: 30.6 pg (ref 26.6–33.0)
MCHC: 31.8 g/dL (ref 31.5–35.7)
MCV: 96 fL (ref 79–97)
Monocytes Absolute: 0.3 10*3/uL (ref 0.1–0.9)
Monocytes: 7 %
NEUTROS PCT: 63 %
Neutrophils Absolute: 2.8 10*3/uL (ref 1.4–7.0)
PLATELETS: 246 10*3/uL (ref 150–379)
RBC: 4.31 x10E6/uL (ref 3.77–5.28)
RDW: 13.8 % (ref 12.3–15.4)
WBC: 4.4 10*3/uL (ref 3.4–10.8)

## 2017-06-24 LAB — THYROID PANEL WITH TSH
FREE THYROXINE INDEX: 1.5 (ref 1.2–4.9)
T3 UPTAKE RATIO: 27 % (ref 24–39)
T4 TOTAL: 5.7 ug/dL (ref 4.5–12.0)
TSH: 2.29 u[IU]/mL (ref 0.450–4.500)

## 2017-06-24 LAB — LIPID PANEL
CHOLESTEROL TOTAL: 189 mg/dL (ref 100–199)
Chol/HDL Ratio: 2 ratio (ref 0.0–4.4)
HDL: 93 mg/dL (ref >39)
LDL Calculated: 87 mg/dL (ref 0–99)
TRIGLYCERIDES: 45 mg/dL (ref 0–149)
VLDL Cholesterol Cal: 9 mg/dL (ref 5–40)

## 2017-06-27 LAB — IGP, APTIMA HPV, RFX 16/18,45
HPV APTIMA: NEGATIVE
PAP SMEAR COMMENT: 0

## 2017-06-29 ENCOUNTER — Ambulatory Visit (HOSPITAL_COMMUNITY): Admission: RE | Admit: 2017-06-29 | Payer: BLUE CROSS/BLUE SHIELD | Source: Ambulatory Visit

## 2017-07-07 DIAGNOSIS — Z1212 Encounter for screening for malignant neoplasm of rectum: Secondary | ICD-10-CM | POA: Diagnosis not present

## 2017-07-07 DIAGNOSIS — Z1211 Encounter for screening for malignant neoplasm of colon: Secondary | ICD-10-CM | POA: Diagnosis not present

## 2017-07-15 ENCOUNTER — Ambulatory Visit (HOSPITAL_COMMUNITY)
Admission: RE | Admit: 2017-07-15 | Discharge: 2017-07-15 | Disposition: A | Discharge status: Home / Self Care | Payer: BLUE CROSS/BLUE SHIELD | Source: Ambulatory Visit | Attending: Nurse Practitioner | Admitting: Nurse Practitioner

## 2017-07-15 DIAGNOSIS — D259 Leiomyoma of uterus, unspecified: Secondary | ICD-10-CM | POA: Diagnosis not present

## 2017-07-19 ENCOUNTER — Telehealth: Payer: Self-pay | Admitting: Nurse Practitioner

## 2017-07-23 LAB — COLOGUARD
Cologuard: NEGATIVE
Cologuard: NEGATIVE

## 2017-07-28 ENCOUNTER — Telehealth: Payer: Self-pay | Admitting: Nurse Practitioner

## 2017-07-28 NOTE — Telephone Encounter (Signed)
Pt aware.

## 2017-08-15 ENCOUNTER — Other Ambulatory Visit: Payer: Self-pay | Admitting: Nurse Practitioner

## 2017-08-15 DIAGNOSIS — Z1231 Encounter for screening mammogram for malignant neoplasm of breast: Secondary | ICD-10-CM

## 2017-09-15 ENCOUNTER — Ambulatory Visit
Admission: RE | Admit: 2017-09-15 | Discharge: 2017-09-15 | Disposition: A | Discharge status: Home / Self Care | Payer: BLUE CROSS/BLUE SHIELD | Source: Ambulatory Visit | Attending: Nurse Practitioner | Admitting: Nurse Practitioner

## 2017-09-15 DIAGNOSIS — Z1231 Encounter for screening mammogram for malignant neoplasm of breast: Secondary | ICD-10-CM | POA: Diagnosis not present

## 2017-10-07 DIAGNOSIS — Z008 Encounter for other general examination: Secondary | ICD-10-CM | POA: Diagnosis not present

## 2017-10-07 DIAGNOSIS — E039 Hypothyroidism, unspecified: Secondary | ICD-10-CM | POA: Diagnosis not present

## 2017-10-21 DIAGNOSIS — E039 Hypothyroidism, unspecified: Secondary | ICD-10-CM | POA: Diagnosis not present

## 2017-10-21 DIAGNOSIS — Z6831 Body mass index (BMI) 31.0-31.9, adult: Secondary | ICD-10-CM | POA: Diagnosis not present

## 2017-11-03 ENCOUNTER — Telehealth: Payer: Self-pay | Admitting: *Deleted

## 2017-11-03 MED ORDER — METRONIDAZOLE 1 % EX GEL
Freq: Every day | CUTANEOUS | 0 refills | Status: DC
Start: 2017-11-03 — End: 2018-04-28

## 2017-11-03 NOTE — Telephone Encounter (Signed)
rx sent to pharmacy

## 2017-11-03 NOTE — Telephone Encounter (Signed)
Fax received [facility] Metronidazole 0.75% cream Not on current med list.  Rxd by Marline Backbone in 2016 per hx Please advise

## 2017-12-02 DIAGNOSIS — E039 Hypothyroidism, unspecified: Secondary | ICD-10-CM | POA: Diagnosis not present

## 2017-12-02 DIAGNOSIS — E559 Vitamin D deficiency, unspecified: Secondary | ICD-10-CM | POA: Diagnosis not present

## 2017-12-08 ENCOUNTER — Ambulatory Visit (INDEPENDENT_AMBULATORY_CARE_PROVIDER_SITE_OTHER): Payer: BLUE CROSS/BLUE SHIELD

## 2017-12-08 DIAGNOSIS — Z23 Encounter for immunization: Secondary | ICD-10-CM | POA: Diagnosis not present

## 2018-02-10 DIAGNOSIS — Z6833 Body mass index (BMI) 33.0-33.9, adult: Secondary | ICD-10-CM | POA: Diagnosis not present

## 2018-02-10 DIAGNOSIS — E039 Hypothyroidism, unspecified: Secondary | ICD-10-CM | POA: Diagnosis not present

## 2018-02-10 DIAGNOSIS — E559 Vitamin D deficiency, unspecified: Secondary | ICD-10-CM | POA: Diagnosis not present

## 2018-02-13 ENCOUNTER — Ambulatory Visit: Payer: BLUE CROSS/BLUE SHIELD | Admitting: Nurse Practitioner

## 2018-02-19 ENCOUNTER — Other Ambulatory Visit: Payer: Self-pay | Admitting: Nurse Practitioner

## 2018-02-19 DIAGNOSIS — E039 Hypothyroidism, unspecified: Secondary | ICD-10-CM

## 2018-02-24 ENCOUNTER — Encounter: Payer: Self-pay | Admitting: Nurse Practitioner

## 2018-02-24 DIAGNOSIS — E559 Vitamin D deficiency, unspecified: Secondary | ICD-10-CM | POA: Diagnosis not present

## 2018-02-24 DIAGNOSIS — E039 Hypothyroidism, unspecified: Secondary | ICD-10-CM | POA: Diagnosis not present

## 2018-03-24 DIAGNOSIS — E039 Hypothyroidism, unspecified: Secondary | ICD-10-CM | POA: Diagnosis not present

## 2018-03-24 DIAGNOSIS — Z008 Encounter for other general examination: Secondary | ICD-10-CM | POA: Diagnosis not present

## 2018-03-24 DIAGNOSIS — E559 Vitamin D deficiency, unspecified: Secondary | ICD-10-CM | POA: Diagnosis not present

## 2018-04-28 ENCOUNTER — Ambulatory Visit: Payer: BLUE CROSS/BLUE SHIELD | Admitting: Nurse Practitioner

## 2018-04-28 ENCOUNTER — Encounter: Payer: Self-pay | Admitting: Nurse Practitioner

## 2018-04-28 VITALS — BP 122/84 | HR 66 | Temp 97.0°F | Ht 60.0 in | Wt 169.0 lb

## 2018-04-28 DIAGNOSIS — D251 Intramural leiomyoma of uterus: Secondary | ICD-10-CM

## 2018-04-28 DIAGNOSIS — N920 Excessive and frequent menstruation with regular cycle: Secondary | ICD-10-CM | POA: Diagnosis not present

## 2018-04-28 DIAGNOSIS — D250 Submucous leiomyoma of uterus: Secondary | ICD-10-CM | POA: Diagnosis not present

## 2018-04-28 LAB — HEMOGLOBIN, FINGERSTICK: Hemoglobin: 14.8 g/dL (ref 11.1–15.9)

## 2018-04-28 NOTE — Patient Instructions (Signed)
Uterine Fibroids    Uterine fibroids are lumps of tissue (tumors) in your womb (uterus). They are not cancer (are benign).  Most women with this condition do not need treatment. Sometimes fibroids can affect your ability to have children (your fertility). If that happens, you may need surgery to take out the fibroids.  Follow these instructions at home:  · Take over-the-counter and prescription medicines only as told by your doctor. Your doctor may suggest NSAIDs (such as aspirin or ibuprofen) to help with pain.  · Ask your doctor if you should:  ? Take iron pills.  ? Eat more foods that have iron in them, such as dark green, leafy vegetables.  · If directed, apply heat to your back or belly to reduce pain. Use the heat source that your doctor recommends, such as a moist heat pack or a heating pad.  ? Put a towel between your skin and the heat source.  ? Leave the heat on for 20-30 minutes.  ? Remove the heat if your skin turns bright red. This is especially important if you are unable to feel pain, heat, or cold. You may have a greater risk of getting burned.  · Pay close attention to your period (menstrual) cycles. Tell your doctor about any changes, such as:  ? A heavier blood flow than usual.  ? Needing to use more pads or tampons than normal.  ? A change in how many days your period lasts.  ? A change in symptoms that come with your period, such as cramps or back pain.  · Keep all follow-up visits as told by your doctor. This is important. Your doctor may need to watch your fibroids over time for any changes.  Contact a doctor if you:  · Have pain that does not get better with medicine or heat, such as pain or cramps in:  ? Your back.  ? The area between your hip bones (pelvic area).  ? Your belly.  · Have new bleeding between your periods.  · Have more bleeding during or between your periods.  · Feel very tired or weak.  · Feel light-headed.  Get help right away if you:  · Pass out (faint).  · Have pain in the  area between your hip bones that suddenly gets worse.  · Have bleeding that soaks a tampon or pad in 30 minutes or less.  Summary  · Uterine fibroids are lumps of tissue (tumors) in your womb (uterus). They are not cancer.  · The only treatment that most women need is taking aspirin or ibuprofen for pain.  · Contact a doctor if you have pain or cramps that do not get better with medicine.  · Make sure you know what symptoms you should get help for right away.  This information is not intended to replace advice given to you by your health care provider. Make sure you discuss any questions you have with your health care provider.  Document Released: 03/13/2010 Document Revised: 01/04/2017 Document Reviewed: 01/04/2017  Elsevier Interactive Patient Education © 2019 Elsevier Inc.

## 2018-04-28 NOTE — Progress Notes (Signed)
   Subjective:    Patient ID: Kristin Powell, female    DOB: 1965/04/21, 53 y.o.   MRN: [id number]   Chief Complaint: Having trouble with fibroids   HPI Patient comes in c/o problems with uterine fibroids. She has been having very heavy periods . No cramping. Periods are last greater then 1 week, and bleeding heavy for about 4 days. She says she just feels so weak during her periods, and has trouble keeping up with work. LMP was 04/22/18 and she is currently spotting   Review of Systems  Constitutional: Negative.   Respiratory: Negative.   Cardiovascular: Negative.   Genitourinary: Positive for vaginal bleeding. Negative for dyspareunia and dysuria.  Neurological: Negative.   Psychiatric/Behavioral: Negative.   All other systems reviewed and are negative.      Objective:   Physical Exam Constitutional:      Appearance: Normal appearance. She is normal weight.  Cardiovascular:     Rate and Rhythm: Normal rate and regular rhythm.     Heart sounds: Normal heart sounds.  Pulmonary:     Effort: Pulmonary effort is normal.     Breath sounds: Normal breath sounds.  Abdominal:     General: Abdomen is flat. Bowel sounds are normal.     Palpations: Abdomen is soft.     Tenderness: There is no abdominal tenderness.  Skin:    General: Skin is warm.  Neurological:     General: No focal deficit present.     Mental Status: She is alert and oriented to person, place, and time.  Psychiatric:        Mood and Affect: Mood normal.        Behavior: Behavior normal.    BP 122/84   Pulse 66   Temp (!) 97 F (36.1 C) (Oral)   Ht 5' (1.524 m)   Wt 169 lb (76.7 kg)   BMI 33.01 kg/m       Assessment & Plan:  Kristin Powell in today with chief complaint of Having trouble with fibroids   1. Menorrhagia with regular cycle  2. Intramural and submucous leiomyoma of uterus hgb tested today Iron supplements daily Patient will let me know when wants to do something further and we will refer  t o GYN  Mary-Margaret Hassell Done, FNP

## 2018-11-09 ENCOUNTER — Other Ambulatory Visit: Payer: Self-pay

## 2018-11-10 ENCOUNTER — Ambulatory Visit: Payer: BLUE CROSS/BLUE SHIELD | Admitting: Physician Assistant

## 2018-11-10 ENCOUNTER — Encounter: Payer: Self-pay | Admitting: Physician Assistant

## 2018-11-10 VITALS — BP 110/74 | HR 65 | Temp 97.7°F | Ht 60.0 in | Wt 170.2 lb

## 2018-11-10 DIAGNOSIS — L918 Other hypertrophic disorders of the skin: Secondary | ICD-10-CM

## 2018-11-10 DIAGNOSIS — D492 Neoplasm of unspecified behavior of bone, soft tissue, and skin: Secondary | ICD-10-CM

## 2018-11-10 NOTE — Patient Instructions (Signed)
Alpha hydroxy acid

## 2018-11-13 MED ORDER — NATURE-THROID 48.75 MG PO TABS
ORAL_TABLET | ORAL | 2 refills | Status: DC
Start: 2018-11-13 — End: 2018-11-14

## 2018-11-13 NOTE — Progress Notes (Signed)
BP 110/74   Pulse 65   Temp 97.7 F (36.5 C) (Temporal)   Ht 5' (1.524 m)   Wt 170 lb 3.2 oz (77.2 kg)   LMP 11/02/2018   SpO2 100%   BMI 33.24 kg/m    Subjective:    Patient ID: Kristin Powell, female    DOB: 10-09-1965, 53 y.o.   MRN: [id number]  HPI: Kristin Powell is a 53 y.o. female presenting on 11/10/2018 for Mass (on nose, lip, and breast) and thyroid med refill   This patient comes in for having a growth on her nose lip and breast.  She states the one on the nose has gotten larger but has never drained or had any opening to it.  But it has grown quite quickly.  She states that she does need a refill on her thyroid medication.  The area on her breast has been a little bit irritated but she does not know of any injury.  She does not know of any skin changes like acne or pustules. Past Medical History:  Diagnosis Date  . Dysuria   . Hypertrophy of uterus   . Rosacea   . Thyroid disease    Relevant past medical, surgical, family and social history reviewed and updated as indicated. Interim medical history since our last visit reviewed. Allergies and medications reviewed and updated. DATA REVIEWED: CHART IN EPIC  Family History reviewed for pertinent findings.  Review of Systems  Constitutional: Negative.   HENT: Negative.   Eyes: Negative.   Respiratory: Negative.   Gastrointestinal: Negative.   Genitourinary: Negative.   Skin: Positive for color change and wound.    Allergies as of 11/10/2018   No Known Allergies     Medication List       Accurate as of November 10, 2018 11:59 PM. If you have any questions, ask your nurse or doctor.        STOP taking these medications   levothyroxine 50 MCG tablet Commonly known as: SYNTHROID Stopped by: Terald Sleeper, PA-C     TAKE these medications   Nature-Throid 48.75 MG Tabs Generic drug: Thyroid TAKE 1 TABLET BY MOUTH ONCE DAILY ON AN EMPTY STOMACH FOR 30 DAYS   VITAMIN D PO Take by mouth.   Vitamin  D3 1.25 MG (50000 UT) Caps Take 1 capsule by mouth once a week.          Objective:    BP 110/74   Pulse 65   Temp 97.7 F (36.5 C) (Temporal)   Ht 5' (1.524 m)   Wt 170 lb 3.2 oz (77.2 kg)   LMP 11/02/2018   SpO2 100%   BMI 33.24 kg/m   No Known Allergies  Wt Readings from Last 3 Encounters:  11/10/18 170 lb 3.2 oz (77.2 kg)  04/28/18 169 lb (76.7 kg)  06/23/17 162 lb (73.5 kg)    Physical Exam Skin:    Comments: Left side of nose with slight prominence that is skin colored, there is no pigmentation, no pustules. Very small skin tag on the left areola.  It matches the color of the areola skin.  There is no irritation.  I have encouraged her to watch and wait.     Results for orders placed or performed in visit on 04/28/18  Hemoglobin, fingerstick  Result Value Ref Range   Hemoglobin 14.8 11.1 - 15.9 g/dL      Assessment & Plan:   1. Skin growth Growth on nose -  Ambulatory referral to Dermatology  2. Skin tag, acquired Watch and wait    Continue all other maintenance medications as listed above.  Follow up plan: No follow-ups on file.  Educational handout given for [place] PA-C [place] [address]  [place], [place] [address] [phone]   11/13/2018, 11:06 PM

## 2018-11-14 ENCOUNTER — Ambulatory Visit (INDEPENDENT_AMBULATORY_CARE_PROVIDER_SITE_OTHER): Payer: BC Managed Care – PPO | Admitting: Family

## 2018-11-14 ENCOUNTER — Encounter: Payer: Self-pay | Admitting: Family

## 2018-11-14 ENCOUNTER — Telehealth: Payer: Self-pay | Admitting: Nurse Practitioner

## 2018-11-14 ENCOUNTER — Ambulatory Visit: Payer: BC Managed Care – PPO | Admitting: Physician Assistant

## 2018-11-14 DIAGNOSIS — E039 Hypothyroidism, unspecified: Secondary | ICD-10-CM

## 2018-11-14 DIAGNOSIS — R399 Unspecified symptoms and signs involving the genitourinary system: Secondary | ICD-10-CM | POA: Diagnosis not present

## 2018-11-14 MED ORDER — FLUCONAZOLE 150 MG PO TABS: 150.0000 mg | ORAL_TABLET | ORAL | 0 refills | Status: DC | PRN

## 2018-11-14 MED ORDER — LEVOTHYROXINE SODIUM 50 MCG PO TABS: 50.0000 ug | ORAL_TABLET | Freq: Every day | ORAL | 3 refills | Status: DC

## 2018-11-14 MED ORDER — CEPHALEXIN 500 MG PO CAPS: 500.0000 mg | ORAL_CAPSULE | Freq: Two times a day (BID) | ORAL | 0 refills | Status: DC

## 2018-11-14 NOTE — Telephone Encounter (Signed)
Has already been done.  

## 2018-11-14 NOTE — Progress Notes (Signed)
Virtual Visit via telephone Note Due to COVID-19 pandemic this visit was conducted virtually. This visit type was conducted due to national recommendations for restrictions regarding the COVID-19 Pandemic (e.g. social distancing, sheltering in place) in an effort to limit this patient's exposure and mitigate transmission in our community. All issues noted in this document were discussed and addressed.  A physical exam was not performed with this format.  I connected with Kristin Powell on 11/14/18 at 11:55 AM by telephone and verified that I am speaking with the correct person using two identifiers. Kristin Powell is currently located at home  and no one is currently with her  during visit. The provider, Evelina Dun, FNP is located in their office at time of visit.  I discussed the limitations, risks, security and privacy concerns of performing an evaluation and management service by telephone and the availability of in person appointments. I also discussed with the patient that there may be a patient responsible charge related to this service. The patient expressed understanding and agreed to proceed.   History and Present Illness:  PT calls the office with UTI symptom that started over a week ago. She states these symptoms have worsen.  She also reports that she has been taking nature thyroid at home, but states it was recalled. She reports she had some levothyroxine left at home and restarted this. She states she feels so much better since switching and would like to continue. She had lab work drawn at her work and states her TSH was normal at that point.  Dysuria  This is a new problem. The current episode started in the past 7 days. The problem occurs intermittently. The problem has been waxing and waning. The quality of the pain is described as burning. The pain is at a severity of 3/10. The pain is mild. Associated symptoms include flank pain, frequency, nausea and urgency. Pertinent negatives  include no hematuria, hesitancy or vomiting. She has tried increased fluids for the symptoms. The treatment provided mild relief.  Thyroid Problem Presents for follow-up visit. Symptoms include fatigue. Patient reports no constipation, depressed mood or diarrhea.      Review of Systems  Constitutional: Positive for fatigue.  Gastrointestinal: Positive for nausea. Negative for constipation, diarrhea and vomiting.  Genitourinary: Positive for dysuria, flank pain, frequency and urgency. Negative for hematuria and hesitancy.  All other systems reviewed and are negative.    Observations/Objective: No SOB or distress noted   Assessment and Plan: 1. UTI symptoms Force fluids AZO over the counter X2 days RTO if symptoms worsen or do not improve  - cephALEXin (KEFLEX) 500 MG capsule; Take 1 capsule (500 mg total) by mouth 2 (two) times daily.  Dispense: 14 capsule; Refill: 0  2. Acquired hypothyroidism Will refill levothyroxine 50 mcg for patient Recheck in 2 months, orders in EPIC  - TSH; Future      I discussed the assessment and treatment plan with the patient. The patient was provided an opportunity to ask questions and all were answered. The patient agreed with the plan and demonstrated an understanding of the instructions.   The patient was advised to call back or seek an in-person evaluation if the symptoms worsen or if the condition fails to improve as anticipated.  The above assessment and management plan was discussed with the patient. The patient verbalized understanding of and has agreed to the management plan. Patient is aware to call the clinic if symptoms persist or worsen. Patient is aware when  to return to the clinic for a follow-up visit. Patient educated on when it is appropriate to go to the emergency department.   Time call ended: 12:07 pm   I provided 12 minutes of non-face-to-face time during this encounter.    Evelina Dun, FNP

## 2018-11-14 NOTE — Telephone Encounter (Signed)
Patient aware.

## 2018-11-27 DIAGNOSIS — L818 Other specified disorders of pigmentation: Secondary | ICD-10-CM | POA: Diagnosis not present

## 2018-11-27 DIAGNOSIS — L708 Other acne: Secondary | ICD-10-CM | POA: Diagnosis not present

## 2018-11-27 DIAGNOSIS — L821 Other seborrheic keratosis: Secondary | ICD-10-CM | POA: Diagnosis not present

## 2018-11-28 ENCOUNTER — Other Ambulatory Visit: Payer: Self-pay | Admitting: Nurse Practitioner

## 2018-11-28 DIAGNOSIS — Z1231 Encounter for screening mammogram for malignant neoplasm of breast: Secondary | ICD-10-CM

## 2019-01-02 ENCOUNTER — Other Ambulatory Visit: Payer: Self-pay

## 2019-01-03 ENCOUNTER — Ambulatory Visit (INDEPENDENT_AMBULATORY_CARE_PROVIDER_SITE_OTHER): Payer: BC Managed Care – PPO

## 2019-01-03 DIAGNOSIS — Z23 Encounter for immunization: Secondary | ICD-10-CM

## 2019-01-12 ENCOUNTER — Other Ambulatory Visit: Payer: Self-pay

## 2019-01-12 ENCOUNTER — Ambulatory Visit
Admission: RE | Admit: 2019-01-12 | Discharge: 2019-01-12 | Disposition: A | Discharge status: Home / Self Care | Payer: BC Managed Care – PPO | Source: Ambulatory Visit | Attending: Nurse Practitioner | Admitting: Nurse Practitioner

## 2019-01-12 DIAGNOSIS — Z1231 Encounter for screening mammogram for malignant neoplasm of breast: Secondary | ICD-10-CM

## 2019-02-28 ENCOUNTER — Other Ambulatory Visit: Payer: Self-pay

## 2019-03-01 ENCOUNTER — Other Ambulatory Visit: Payer: Self-pay

## 2019-03-01 ENCOUNTER — Ambulatory Visit: Payer: BC Managed Care – PPO | Admitting: Family Medicine

## 2019-03-01 ENCOUNTER — Ambulatory Visit (INDEPENDENT_AMBULATORY_CARE_PROVIDER_SITE_OTHER): Payer: BC Managed Care – PPO

## 2019-03-01 ENCOUNTER — Encounter: Payer: Self-pay | Admitting: Family Medicine

## 2019-03-01 VITALS — BP 113/69 | HR 68 | Temp 96.8°F | Ht 60.0 in | Wt 172.6 lb

## 2019-03-01 DIAGNOSIS — M545 Low back pain, unspecified: Secondary | ICD-10-CM

## 2019-03-01 DIAGNOSIS — S3992XA Unspecified injury of lower back, initial encounter: Secondary | ICD-10-CM | POA: Diagnosis not present

## 2019-03-01 DIAGNOSIS — M1990 Unspecified osteoarthritis, unspecified site: Secondary | ICD-10-CM

## 2019-03-01 DIAGNOSIS — E039 Hypothyroidism, unspecified: Secondary | ICD-10-CM | POA: Diagnosis not present

## 2019-03-01 MED ORDER — PREDNISONE 10 MG (21) PO TBPK: ORAL_TABLET | ORAL | 0 refills | Status: DC

## 2019-03-01 MED ORDER — MELOXICAM 7.5 MG PO TABS: 7.5000 mg | ORAL_TABLET | Freq: Every day | ORAL | 0 refills | Status: DC

## 2019-03-01 NOTE — Patient Instructions (Signed)

## 2019-03-01 NOTE — Progress Notes (Signed)
Assessment & Plan:  1. Acute right-sided low back pain without sciatica - Back exercises provided for patient to complete at home daily. - DG Lumbar Spine 2-3 Views - meloxicam (MOBIC) 7.5 MG tablet; Take 1 tablet (7.5 mg total) by mouth daily.  Dispense: 30 tablet; Refill: 0 - predniSONE (STERAPRED UNI-PAK 21 TAB) 10 MG (21) TBPK tablet; Use as directed on back of pill pack  Dispense: 21 tablet; Refill: 0  2. Hypothyroidism, unspecified type - TSH  3. Arthritis - Discussed arthritis and bilateral shoulders.  Patient does recall that when she was doing regular exercise her shoulders felt much better.  She will go back to doing this.  Also discussed the meloxicam will help with any pain she is having in them.  May also use Tylenol as needed.  Follow up plan: Return if symptoms worsen or fail to improve.  Hendricks Limes, MSN, APRN, FNP-C [facility]  Subjective:   Patient ID: Kristin Powell, female    DOB: 06/25/1965, 54 y.o.   MRN: [id number]  HPI: Kristin Powell is a 54 y.o. female presenting on 03/01/2019 for Hip Pain (bilateral mostly right, worse when working works at [employer] ), Shoulder Pain (both shoulders ), and Thyroid Problem (Patients states she gets foggy )  Patient reports he is experiencing bilateral hip and shoulder pain with her hips bothering her more than her shoulders.  Patient reports she fell a few months ago when she slipped up in some water.  She fell again a few weeks ago when she was standing on a cinder block trying to reach into the recycle bin.  Her right hip has been in constant pain since that second fall.  She reports her shoulders "feel crunchy".  Patient reports because of her right hip she is having a hard time at work as it is very physical and she feels like they are watching her.  Patient would also like her thyroid checked while she is here as she feels her dosage needs to be adjusted as she is getting foggy and forgetful.  She is also  getting tired more easily.  She gives the example of needing to go sit down after doing the dishes because she is tired.   ROS: Negative unless specifically indicated above in HPI.   Relevant past medical history reviewed and updated as indicated.   Allergies and medications reviewed and updated.   Current Outpatient Medications:  .  Cholecalciferol (VITAMIN D3) 1.25 MG (50000 UT) CAPS, Take 1 capsule by mouth once a week., Disp: , Rfl:  .  levothyroxine (SYNTHROID) 50 MCG tablet, Take 1 tablet (50 mcg total) by mouth daily., Disp: 90 tablet, Rfl: 3 .  meloxicam (MOBIC) 7.5 MG tablet, Take 1 tablet (7.5 mg total) by mouth daily., Disp: 30 tablet, Rfl: 0 .  predniSONE (STERAPRED UNI-PAK 21 TAB) 10 MG (21) TBPK tablet, Use as directed on back of pill pack, Disp: 21 tablet, Rfl: 0 .  tretinoin (RETIN-A) 0.025 % cream, Apply 1 application topically at bedtime., Disp: , Rfl:   No Known Allergies  Objective:   BP 113/69   Pulse 68   Temp (!) 96.8 F (36 C) (Temporal)   Ht 5' (1.524 m)   Wt 172 lb 9.6 oz (78.3 kg)   SpO2 99%   BMI 33.71 kg/m    Physical Exam Vitals reviewed.  Constitutional:      General: She is not in acute distress.    Appearance: Normal appearance. She is  obese. She is not ill-appearing, toxic-appearing or diaphoretic.  HENT:     Head: Normocephalic and atraumatic.  Eyes:     General: No scleral icterus.       Right eye: No discharge.        Left eye: No discharge.     Conjunctiva/sclera: Conjunctivae normal.  Cardiovascular:     Rate and Rhythm: Normal rate.  Pulmonary:     Effort: Pulmonary effort is normal. No respiratory distress.  Musculoskeletal:        General: Normal range of motion.     Right shoulder: Normal.     Left shoulder: Normal.     Cervical back: Normal range of motion.     Lumbar back: Tenderness present. No swelling, edema, deformity, signs of trauma, lacerations, spasms (right side) or bony tenderness. No scoliosis.     Right hip:  Normal.     Left hip: Normal.     Comments: Patient does have occasional popping in her shoulders during active range of motion. Internal and external rotation of the hips does not cause pain in the hips but in the low back on the right side.  Skin:    General: Skin is warm and dry.     Capillary Refill: Capillary refill takes less than 2 seconds.  Neurological:     General: No focal deficit present.     Mental Status: She is alert and oriented to person, place, and time. Mental status is at baseline.  Psychiatric:        Mood and Affect: Mood normal.        Behavior: Behavior normal.        Thought Content: Thought content normal.        Judgment: Judgment normal.

## 2019-03-02 LAB — TSH: TSH: 1.97 u[IU]/mL (ref 0.450–4.500)

## 2019-03-05 ENCOUNTER — Encounter: Payer: Self-pay | Admitting: Family Medicine

## 2019-06-29 ENCOUNTER — Other Ambulatory Visit: Payer: Self-pay | Admitting: Family Medicine

## 2019-06-29 DIAGNOSIS — M545 Low back pain, unspecified: Secondary | ICD-10-CM

## 2019-07-04 ENCOUNTER — Telehealth: Payer: Self-pay | Admitting: Nurse Practitioner

## 2019-07-04 NOTE — Telephone Encounter (Signed)
Left message for patient to call back to schedule an appt to be seen

## 2019-07-04 NOTE — Telephone Encounter (Signed)
  Incoming Patient Call  07/04/2019  What symptoms do you have? Lower back pain and urgency and dizzy , that eased up then itching and burning and sick to stomach her stomach felt a little swollen  How long have you been sick? For a couple of weeks  Have you been seen for this problem? No, would like antibiotic that was prescribed when she last had a UTI because this helped   If your provider decides to give you a prescription, which pharmacy would you like for it to be sent to? [facility]    Patient informed that this information will be sent to the clinical staff for review and that they should receive a follow up call.

## 2019-07-05 ENCOUNTER — Ambulatory Visit (INDEPENDENT_AMBULATORY_CARE_PROVIDER_SITE_OTHER): Payer: BC Managed Care – PPO | Admitting: Nurse Practitioner

## 2019-07-05 DIAGNOSIS — N3000 Acute cystitis without hematuria: Secondary | ICD-10-CM | POA: Diagnosis not present

## 2019-07-05 MED ORDER — FLUCONAZOLE 150 MG PO TABS: 150.0000 mg | ORAL_TABLET | Freq: Once | ORAL | 0 refills | Status: AC

## 2019-07-05 MED ORDER — CEPHALEXIN 500 MG PO CAPS: 500.0000 mg | ORAL_CAPSULE | Freq: Two times a day (BID) | ORAL | 0 refills | Status: DC

## 2019-07-05 NOTE — Progress Notes (Signed)
Virtual Visit via telephone Note Due to COVID-19 pandemic this visit was conducted virtually. This visit type was conducted due to national recommendations for restrictions regarding the COVID-19 Pandemic (e.g. social distancing, sheltering in place) in an effort to limit this patient's exposure and mitigate transmission in our community. All issues noted in this document were discussed and addressed.  A physical exam was not performed with this format.  I connected with Kristin Powell on 07/05/19 at 8:35 by telephone and verified that I am speaking with the correct person using two identifiers. Kristin Powell is currently located at home and no one is currently with her during visit. The provider, Mary-Margaret Hassell Done, FNP is located in their office at time of visit.  I discussed the limitations, risks, security and privacy concerns of performing an evaluation and management service by telephone and the availability of in person appointments. I also discussed with the patient that there may be a patient responsible charge related to this service. The patient expressed understanding and agreed to proceed.   History and Present Illness:   Chief Complaint: Urinary Tract Infection   HPI Patient calls in for appointment. She has had dysuria for over a week now. Ha increasingly gotten worse. She ha been taking aZO OTC which ha helped with symptoms.   Review of Systems  Constitutional: Negative.   Respiratory: Negative.   Cardiovascular: Negative.   Genitourinary: Positive for dysuria, frequency and urgency. Negative for flank pain and hematuria.  Musculoskeletal: Negative for back pain.  All other systems reviewed and are negative.    Observations/Objective: Alert and oriented- answers all questions appropriately No distress  Assessment and Plan: Kristin Powell in today with chief complaint of Urinary Tract Infection   1. Acute cystitis without hematuria Take medication as prescribe  Cotton underwear Take shower not bath Cranberry juice, yogurt Force fluids AZO over the counter X2 days RTO prn  Meds ordered this encounter  Medications  . cephALEXin (KEFLEX) 500 MG capsule    Sig: Take 1 capsule (500 mg total) by mouth 2 (two) times daily.    Dispense:  20 capsule    Refill:  0    Order Specific Question:   Supervising Provider    Answer:   Caryl Pina A [id number]  . fluconazole (DIFLUCAN) 150 MG tablet    Sig: Take 1 tablet (150 mg total) by mouth once for 1 dose.    Dispense:  1 tablet    Refill:  0    Order Specific Question:   Supervising Provider    Answer:   Caryl Pina A [id number]       Follow Up Instructions: prn    I discussed the assessment and treatment plan with the patient. The patient was provided an opportunity to ask questions and all were answered. The patient agreed with the plan and demonstrated an understanding of the instructions.   The patient was advised to call back or seek an in-person evaluation if the symptoms worsen or if the condition fails to improve as anticipated.  The above assessment and management plan was discussed with the patient. The patient verbalized understanding of and has agreed to the management plan. Patient is aware to call the clinic if symptoms persist or worsen. Patient is aware when to return to the clinic for a follow-up visit. Patient educated on when it is appropriate to go to the emergency department.   Time call ended:  8:45  I provided 10 minutes of non-face-to-face time during this  encounter.    Mary-Margaret Hassell Done, FNP

## 2019-07-13 NOTE — Telephone Encounter (Signed)
Televisit was done on 07-05-19.

## 2019-09-21 ENCOUNTER — Telehealth: Payer: Self-pay | Admitting: Nurse Practitioner

## 2019-09-21 NOTE — Telephone Encounter (Signed)
error 

## 2019-09-26 ENCOUNTER — Ambulatory Visit: Payer: BC Managed Care – PPO | Admitting: Family Medicine

## 2019-12-10 ENCOUNTER — Telehealth: Payer: Self-pay

## 2019-12-10 NOTE — Telephone Encounter (Signed)
  Incoming Patient Call  12/10/2019  What symptoms do you have? Bugs/lice   How long have you been sick? Not sure  Have you been seen for this problem? no  If your provider decides to give you a prescription, which pharmacy would you like for it to be sent to? [facility] [place]   Patient informed that this information will be sent to the clinical staff for review and that they should receive a follow up call.

## 2019-12-10 NOTE — Telephone Encounter (Signed)
Appointment scheduled.

## 2019-12-11 ENCOUNTER — Encounter: Payer: Self-pay | Admitting: Family Medicine

## 2019-12-11 ENCOUNTER — Ambulatory Visit (INDEPENDENT_AMBULATORY_CARE_PROVIDER_SITE_OTHER): Payer: BC Managed Care – PPO | Admitting: Family Medicine

## 2019-12-11 DIAGNOSIS — L299 Pruritus, unspecified: Secondary | ICD-10-CM

## 2019-12-11 DIAGNOSIS — B852 Pediculosis, unspecified: Secondary | ICD-10-CM

## 2019-12-11 NOTE — Progress Notes (Signed)
Subjective:    Patient ID: Kristin Powell, female    DOB: 24-Apr-1965, 54 y.o.   MRN: [id number]   HPI: Kristin Powell is a 54 y.o. female presenting for staying outside a lot and picked up something. Concerned for lice. Something in the carpet. Seems to be around her bed and bathroom. Seems to come in with her. Not sure what it is, but it makes her itch. No lesions. Can't afford exterminator. Worst when she does yard work.-When I walk on it with the bedroom where I brush my hair    Depression screen [facility] 2/9 07/05/2019 03/01/2019 04/28/2018 06/23/2017 09/02/2016  Decreased Interest 0 0 0 0 0  Down, Depressed, Hopeless 0 0 0 0 0  PHQ - 2 Score 0 0 0 0 0  Altered sleeping - - - - -  Tired, decreased energy - - - - -  Change in appetite - - - - -  Feeling bad or failure about yourself  - - - - -  Trouble concentrating - - - - -  Moving slowly or fidgety/restless - - - - -  Suicidal thoughts - - - - -  PHQ-9 Score - - - - -     Relevant past medical, surgical, family and social history reviewed and updated as indicated.  Interim medical history since our last visit reviewed. Allergies and medications reviewed and updated.  ROS:  Review of Systems  Constitutional: Negative.   HENT: Negative.   Respiratory: Negative for shortness of breath.   Cardiovascular: Negative for chest pain.  Gastrointestinal: Negative for abdominal pain.  Musculoskeletal: Negative for arthralgias.     Social History   Tobacco Use  Smoking Status Former Smoker  . Quit date: 01/06/1997  . Years since quitting: 22.9  Smokeless Tobacco Never Used       Objective:     Wt Readings from Last 3 Encounters:  03/01/19 172 lb 9.6 oz (78.3 kg)  11/10/18 170 lb 3.2 oz (77.2 kg)  04/28/18 169 lb (76.7 kg)     Exam deferred. Pt. Harboring due to COVID 19. Phone visit performed.   Assessment & Plan:   1. Pruritus   2. Infestation, lice     We discussed various interventions that should be helpful.  This  included washing the bed clothing in hot water.  Mayonnaise treatment for the head for about 4 hours.  Hot shower followed by over-the-counter red or similar agent.  Treating the backyard with a spray on insecticide.  As a secondary possibility allergy may need to be considered.  Diagnoses and all orders for this visit:  Pruritus  Infestation, lice    Virtual Visit via telephone Note  I discussed the limitations, risks, security and privacy concerns of performing an evaluation and management service by telephone and the availability of in person appointments. The patient was identified with two identifiers. Pt.expressed understanding and agreed to proceed. Pt. Is at home. Dr. Livia Snellen is in his office.  Follow Up Instructions:   I discussed the assessment and treatment plan with the patient. The patient was provided an opportunity to ask questions and all were answered. The patient agreed with the plan and demonstrated an understanding of the instructions.   The patient was advised to call back or seek an in-person evaluation if the symptoms worsen or if the condition fails to improve as anticipated.   Total minutes including chart review and phone contact time: 19   Follow up plan: Return if symptoms  worsen or fail to improve.  Claretta Fraise, MD [place]

## 2019-12-13 MED ORDER — LINDANE 1 % EX SHAM: 1.0000 "application " | MEDICATED_SHAMPOO | Freq: Once | CUTANEOUS | 0 refills | Status: AC

## 2019-12-13 NOTE — Telephone Encounter (Signed)
Please advise 

## 2019-12-13 NOTE — Telephone Encounter (Signed)
  Prescription Request  12/13/2019  What is the name of the medication or equipment? Pt wants prescriptions meds for lice called into pharmacy  Have you contacted your pharmacy to request a refill? (if applicable) no  Which pharmacy would you like this sent to? [facility]   Patient notified that their request is being sent to the clinical staff for review and that they should receive a response within 2 business days.

## 2019-12-13 NOTE — Telephone Encounter (Signed)
[place] rx sent to pharmacy

## 2019-12-14 ENCOUNTER — Telehealth: Payer: Self-pay

## 2019-12-14 MED ORDER — LINDANE 1 % EX SHAM: 1.0000 "application " | MEDICATED_SHAMPOO | Freq: Once | CUTANEOUS | 0 refills | Status: AC

## 2019-12-14 NOTE — Telephone Encounter (Signed)
Was it supposed to be a lice treatment med sent to [facility] in [place]. No prescription was sent yesterday

## 2019-12-14 NOTE — Telephone Encounter (Signed)
Sent prescritpion yesterday

## 2019-12-14 NOTE — Telephone Encounter (Signed)
Attempted to contact - number not working

## 2020-07-30 DIAGNOSIS — H04123 Dry eye syndrome of bilateral lacrimal glands: Secondary | ICD-10-CM | POA: Diagnosis not present

## 2020-07-30 DIAGNOSIS — H02885 Meibomian gland dysfunction left lower eyelid: Secondary | ICD-10-CM | POA: Diagnosis not present

## 2020-07-30 DIAGNOSIS — H02882 Meibomian gland dysfunction right lower eyelid: Secondary | ICD-10-CM | POA: Diagnosis not present

## 2020-08-08 ENCOUNTER — Ambulatory Visit: Payer: BC Managed Care – PPO | Admitting: Nurse Practitioner

## 2020-08-08 ENCOUNTER — Other Ambulatory Visit: Payer: Self-pay

## 2020-08-08 ENCOUNTER — Encounter: Payer: Self-pay | Admitting: Nurse Practitioner

## 2020-08-08 ENCOUNTER — Ambulatory Visit (INDEPENDENT_AMBULATORY_CARE_PROVIDER_SITE_OTHER): Payer: BC Managed Care – PPO

## 2020-08-08 VITALS — BP 116/71 | HR 51 | Temp 97.5°F | Resp 20 | Ht 60.0 in | Wt 163.0 lb

## 2020-08-08 DIAGNOSIS — M25551 Pain in right hip: Secondary | ICD-10-CM

## 2020-08-08 DIAGNOSIS — M160 Bilateral primary osteoarthritis of hip: Secondary | ICD-10-CM | POA: Diagnosis not present

## 2020-08-08 MED ORDER — METHYLPREDNISOLONE ACETATE 40 MG/ML IJ SUSP
80.0000 mg | Freq: Once | INTRAMUSCULAR | Status: AC
  Administered 2020-08-08: 80 mg via INTRAMUSCULAR

## 2020-08-08 MED ORDER — PREDNISONE 20 MG PO TABS: 40.0000 mg | ORAL_TABLET | Freq: Every day | ORAL | 0 refills | Status: AC

## 2020-08-08 NOTE — Progress Notes (Signed)
   Subjective:    Patient ID: Kristin Powell, female    DOB: Nov 23, 1965, 55 y.o.   MRN: [id number]   Chief Complaint: bilateral hip pain   HPI Patient come sin today c/o bil hip pain, has been going on for a couple of years. Pain is in both hips and radiates toward front. Squating standing and walking a lot hurts. Hard to get in and out of car. She tripped and fell last week which has made right ip pain worse. Hurts when she lays down. Rates pain 4/10 today. She was taken ibuprofen which did not help so she has been taking tylenol and that helps better.     Review of Systems     Objective:   Physical Exam Vitals and nursing note reviewed.  Genitourinary:    Vagina: No vaginal discharge.  Musculoskeletal:     Comments: ROM of right hip without pain.      BP 116/71   Pulse (!) 51   Temp (!) 97.5 F (36.4 C) (Temporal)   Resp 20   Ht 5' (1.524 m)   Wt 163 lb (73.9 kg)   SpO2 97%   BMI 31.83 kg/m  Right hip xray- mild osteoarthritis-Preliminary reading by Ronnald Collum, FNP  [facility]     Assessment & Plan:  Kristin Powell in today with chief complaint of bilateral hip pain   1. Right hip pain Thinks it is more sciatica then hip pain mOist heat Rest RTO prn - DG HIP UNILAT W OR W/O PELVIS 2-3 VIEWS RIGHT - methylPREDNISolone acetate (DEPO-MEDROL) injection 80 mg - predniSONE (DELTASONE) 20 MG tablet; Take 2 tablets (40 mg total) by mouth daily with breakfast for 5 days. 2 po daily for 5 days  Dispense: 10 tablet; Refill: 0    The above assessment and management plan was discussed with the patient. The patient verbalized understanding of and has agreed to the management plan. Patient is aware to call the clinic if symptoms persist or worsen. Patient is aware when to return to the clinic for a follow-up visit. Patient educated on when it is appropriate to go to the emergency department.   Mary-Margaret Hassell Done, FNP

## 2020-08-08 NOTE — Patient Instructions (Signed)

## 2020-09-09 DIAGNOSIS — R519 Headache, unspecified: Secondary | ICD-10-CM | POA: Diagnosis not present

## 2020-09-09 DIAGNOSIS — R42 Dizziness and giddiness: Secondary | ICD-10-CM | POA: Diagnosis not present

## 2020-09-09 DIAGNOSIS — H8309 Labyrinthitis, unspecified ear: Secondary | ICD-10-CM | POA: Diagnosis not present

## 2020-09-09 DIAGNOSIS — R110 Nausea: Secondary | ICD-10-CM | POA: Diagnosis not present

## 2020-09-29 ENCOUNTER — Other Ambulatory Visit: Payer: Self-pay | Admitting: Nurse Practitioner

## 2020-10-13 ENCOUNTER — Ambulatory Visit (INDEPENDENT_AMBULATORY_CARE_PROVIDER_SITE_OTHER): Payer: BC Managed Care – PPO | Admitting: Nurse Practitioner

## 2020-10-13 ENCOUNTER — Encounter: Payer: Self-pay | Admitting: Nurse Practitioner

## 2020-10-13 DIAGNOSIS — B373 Candidiasis of vulva and vagina: Secondary | ICD-10-CM

## 2020-10-13 DIAGNOSIS — R399 Unspecified symptoms and signs involving the genitourinary system: Secondary | ICD-10-CM

## 2020-10-13 DIAGNOSIS — B3731 Acute candidiasis of vulva and vagina: Secondary | ICD-10-CM

## 2020-10-13 MED ORDER — FLUCONAZOLE 150 MG PO TABS: 150.0000 mg | ORAL_TABLET | Freq: Once | ORAL | 0 refills | Status: AC

## 2020-10-13 MED ORDER — CEPHALEXIN 500 MG PO CAPS: 500.0000 mg | ORAL_CAPSULE | ORAL | 0 refills | Status: DC

## 2020-10-13 NOTE — Progress Notes (Signed)
   Virtual Visit  Note Due to COVID-19 pandemic this visit was conducted virtually. This visit type was conducted due to national recommendations for restrictions regarding the COVID-19 Pandemic (e.g. social distancing, sheltering in place) in an effort to limit this patient's exposure and mitigate transmission in our community. All issues noted in this document were discussed and addressed.  A physical exam was not performed with this format.  I connected with Jamelle Rushing on 10/13/20 at 4:48 by telephone and verified that I am speaking with the correct person using two identifiers. Jeannene Monjaraz is currently located at home and no one is currently with her during visit. The provider, Mary-Margaret Hassell Done, FNP is located in their office at time of visit.  I discussed the limitations, risks, security and privacy concerns of performing an evaluation and management service by telephone and the availability of in person appointments. I also discussed with the patient that there may be a patient responsible charge related to this service. The patient expressed understanding and agreed to proceed.   History and Present Illness:  Chief Complaint: Urinary Tract Infection   HPI Patient calls in c/o low back pain , dysuria, suprapubic pain a few days ago. Has slight dysuria, malodorous urine. Has urinary freq and urgency. Has thick vaginal discharge with itching.   Review of Systems  Constitutional:  Negative for chills and fever.  Genitourinary:  Positive for dysuria, frequency and urgency. Negative for flank pain and hematuria.  All other systems reviewed and are negative.   Observations/Objective: Alert and oriented- answers all questions appropriately No distress    Assessment and Plan: Jamelle Rushing in today with chief complaint of Urinary Tract Infection   1. UTI symptoms Take medication as prescribe Cotton underwear Take shower not bath Cranberry juice, yogurt Force fluids AZO over  the counter X2 days RTO prn  - cephALEXin (KEFLEX) 500 MG capsule; Take 1 capsule (500 mg total) by mouth once a week.  Dispense: 2 capsule; Refill: 0  2. Vagina, candidiasis Avoid bubble baths - fluconazole (DIFLUCAN) 150 MG tablet; Take 1 tablet (150 mg total) by mouth once for 1 dose.  Dispense: 1 tablet; Refill: 0     Follow Up Instructions: prn    I discussed the assessment and treatment plan with the patient. The patient was provided an opportunity to ask questions and all were answered. The patient agreed with the plan and demonstrated an understanding of the instructions.   The patient was advised to call back or seek an in-person evaluation if the symptoms worsen or if the condition fails to improve as anticipated.  The above assessment and management plan was discussed with the patient. The patient verbalized understanding of and has agreed to the management plan. Patient is aware to call the clinic if symptoms persist or worsen. Patient is aware when to return to the clinic for a follow-up visit. Patient educated on when it is appropriate to go to the emergency department.   Time call ended:  5:00  I provided 12 minutes of  non face-to-face time during this encounter.    Mary-Margaret Hassell Done, FNP

## 2020-12-23 ENCOUNTER — Other Ambulatory Visit: Payer: Self-pay

## 2020-12-23 ENCOUNTER — Ambulatory Visit (INDEPENDENT_AMBULATORY_CARE_PROVIDER_SITE_OTHER): Payer: BC Managed Care – PPO

## 2020-12-23 DIAGNOSIS — Z23 Encounter for immunization: Secondary | ICD-10-CM | POA: Diagnosis not present

## 2021-03-26 ENCOUNTER — Other Ambulatory Visit: Payer: Self-pay

## 2021-03-26 DIAGNOSIS — Z1211 Encounter for screening for malignant neoplasm of colon: Secondary | ICD-10-CM

## 2021-04-21 DIAGNOSIS — Z1212 Encounter for screening for malignant neoplasm of rectum: Secondary | ICD-10-CM | POA: Diagnosis not present

## 2021-04-21 DIAGNOSIS — Z1211 Encounter for screening for malignant neoplasm of colon: Secondary | ICD-10-CM | POA: Diagnosis not present

## 2021-04-24 ENCOUNTER — Encounter: Payer: Self-pay | Admitting: Nurse Practitioner

## 2021-04-30 LAB — COLOGUARD: COLOGUARD: NEGATIVE

## 2021-05-26 ENCOUNTER — Telehealth: Payer: Self-pay | Admitting: Nurse Practitioner

## 2021-05-26 NOTE — Telephone Encounter (Signed)
Called and spoke with [employer]. Looks like another Cologuard was ordered and patient just completed one in February that was negative. Advised them to cancel the new order.  ?

## 2021-08-20 ENCOUNTER — Other Ambulatory Visit: Payer: Self-pay | Admitting: Nurse Practitioner

## 2021-08-20 DIAGNOSIS — Z1231 Encounter for screening mammogram for malignant neoplasm of breast: Secondary | ICD-10-CM

## 2021-08-21 ENCOUNTER — Ambulatory Visit: Payer: BC Managed Care – PPO | Admitting: Family

## 2021-08-21 ENCOUNTER — Encounter: Payer: Self-pay | Admitting: Nurse Practitioner

## 2021-08-21 ENCOUNTER — Ambulatory Visit: Payer: BC Managed Care – PPO | Admitting: Nurse Practitioner

## 2021-08-21 VITALS — BP 133/87 | HR 69 | Temp 98.8°F | Ht 60.0 in | Wt 170.8 lb

## 2021-08-21 DIAGNOSIS — M25551 Pain in right hip: Secondary | ICD-10-CM

## 2021-08-21 DIAGNOSIS — R300 Dysuria: Secondary | ICD-10-CM | POA: Diagnosis not present

## 2021-08-21 DIAGNOSIS — H9201 Otalgia, right ear: Secondary | ICD-10-CM

## 2021-08-21 LAB — URINALYSIS, ROUTINE W REFLEX MICROSCOPIC
Bilirubin, UA: NEGATIVE
Glucose, UA: NEGATIVE
Ketones, UA: NEGATIVE
Leukocytes,UA: NEGATIVE
Nitrite, UA: NEGATIVE
Protein,UA: NEGATIVE
Specific Gravity, UA: 1.01 (ref 1.005–1.030)
Urobilinogen, Ur: 0.2 mg/dL (ref 0.2–1.0)
pH, UA: 5 (ref 5.0–7.5)

## 2021-08-21 LAB — MICROSCOPIC EXAMINATION
Bacteria, UA: NONE SEEN
Epithelial Cells (non renal): NONE SEEN /hpf (ref 0–10)
Renal Epithel, UA: NONE SEEN /hpf
WBC, UA: NONE SEEN /hpf (ref 0–5)

## 2021-08-21 MED ORDER — ACETAMINOPHEN 500 MG PO TABS
500.0000 mg | ORAL_TABLET | Freq: Four times a day (QID) | ORAL | 0 refills | Status: DC | PRN
Start: 2021-08-21 — End: 2022-02-01

## 2021-08-21 MED ORDER — PREDNISONE 20 MG PO TABS
20.0000 mg | ORAL_TABLET | Freq: Every day | ORAL | 0 refills | Status: DC
Start: 2021-08-21 — End: 2021-10-13

## 2021-08-21 NOTE — Patient Instructions (Signed)
Dysuria Dysuria is pain or discomfort during urination. The pain or discomfort may be felt in the part of the body that drains urine from the bladder (urethra) or in the surrounding tissue of the genitals. The pain may also be felt in the groin area, lower abdomen, or lower back. You may have to urinate frequently or have the sudden feeling that you have to urinate (urgency). Dysuria can affect anyone, but it is more common in females. Dysuria can be caused by many different things, including: Urinary tract infection. Kidney stones or bladder stones. Certain STIs (sexually transmitted infections), such as chlamydia. Dehydration. Inflammation of the tissues of the vagina. Use of certain medicines. Use of certain soaps or scented products that cause irritation. Follow these instructions at home: Medicines Take over-the-counter and prescription medicines only as told by your health care provider. If you were prescribed an antibiotic medicine, take it as told by your health care provider. Do not stop taking the antibiotic even if you start to feel better. Eating and drinking  Drink enough fluid to keep your urine pale yellow. Avoid caffeinated beverages, tea, and alcohol. These beverages can irritate the bladder and make dysuria worse. In males, alcohol may irritate the prostate. General instructions Watch your condition for any changes. Urinate often. Avoid holding urine for long periods of time. If you are female, you should wipe from front to back after urinating or having a bowel movement. Use each piece of toilet paper only once. Empty your bladder after sex. Keep all follow-up visits. This is important. If you had any tests done to find the cause of dysuria, it is up to you to get your test results. Ask your health care provider, or the department that is doing the test, when your results will be ready. Contact a health care provider if: You have a fever. You develop pain in your back or  sides. You have nausea or vomiting. You have blood in your urine. You are not urinating as often as you usually do. Get help right away if: Your pain is severe and not relieved with medicines. You cannot eat or drink without vomiting. You are confused. You have a rapid heartbeat while resting. You have shaking or chills. You feel extremely weak. Summary Dysuria is pain or discomfort while urinating. Many different conditions can lead to dysuria. If you have dysuria, you may have to urinate frequently or have the sudden feeling that you have to urinate (urgency). Watch your condition for any changes. Keep all follow-up visits. Make sure that you urinate often and drink enough fluid to keep your urine pale yellow. This information is not intended to replace advice given to you by your health care provider. Make sure you discuss any questions you have with your health care provider. Document Revised: 09/21/2019 Document Reviewed: 09/21/2019 Elsevier Patient Education  [place], Adult An earache, or ear pain, can be caused by many things, including: An infection. Ear wax buildup. Ear pressure. Something in the ear that should not be there (foreign body). A sore throat. Tooth problems. Jaw problems. Treatment of the earache will depend on the cause. If the cause is not clear or cannot be determined, you may need to watch your symptoms until your earache goes away or until a cause is found. Follow these instructions at home: Medicines Take or apply over-the-counter and prescription medicines only as told by your health care provider. If you were prescribed an antibiotic medicine, use it as told by  your health care provider. Do not stop using the antibiotic even if you start to feel better. Do not put anything in your ear other than medicine that is prescribed by your health care provider. Managing pain If directed, apply heat to the affected area as often as told by  your health care provider. Use the heat source that your health care provider recommends, such as a moist heat pack or a heating pad. Place a towel between your skin and the heat source. Leave the heat on for 20-30 minutes. Remove the heat if your skin turns bright red. This is especially important if you are unable to feel pain, heat, or cold. You may have a greater risk of getting burned. If directed, put ice on the affected area as often as told by your health care provider. To do this:     Put ice in a plastic bag. Place a towel between your skin and the bag. Leave the ice on for 20 minutes, 2-3 times a day. General instructions Pay attention to any changes in your symptoms. Try resting in an upright position instead of lying down. This may help to reduce pressure in your ear and relieve pain. Chew gum if it helps to relieve your ear pain. Treat any allergies as told by your health care provider. Drink enough fluid to keep your urine pale yellow. It is up to you to get the results of any tests that were done. Ask your health care provider, or the department that is doing the tests, when your results will be ready. Keep all follow-up visits as told by your health care provider. This is important. Contact a health care provider if: Your pain does not improve within 2 days. Your earache gets worse. You have new symptoms. You have a fever. Get help right away if you: Have a severe headache. Have a stiff neck. Have trouble swallowing. Have redness or swelling behind your ear. Have fluid or blood coming from your ear. Have hearing loss. Feel dizzy. Summary An earache, or ear pain, can be caused by many things. Treatment of the earache will depend on the cause. Follow recommendations from your health care provider to treat your ear pain. If the cause is not clear or cannot be determined, you may need to watch your symptoms until your earache goes away or until a cause is found. Keep  all follow-up visits as told by your health care provider. This is important. This information is not intended to replace advice given to you by your health care provider. Make sure you discuss any questions you have with your health care provider. Document Revised: 09/15/2018 Document Reviewed: 09/16/2018 Elsevier Patient Education  [place]. Hip Pain The hip is the joint between the upper legs and the lower pelvis. The bones, cartilage, tendons, and muscles of your hip joint support your body and allow you to move around. Hip pain can range from a minor ache to severe pain in one or both of your hips. The pain may be felt on the inside of the hip joint near the groin, or on the outside near the buttocks and upper thigh. You may also have swelling or stiffness in your hip area. Follow these instructions at home: Managing pain, stiffness, and swelling     If directed, put ice on the painful area. To do this: Put ice in a plastic bag. Place a towel between your skin and the bag. Leave the ice on for 20 minutes, 2-3  times a day. If directed, apply heat to the affected area as often as told by your health care provider. Use the heat source that your health care provider recommends, such as a moist heat pack or a heating pad. Place a towel between your skin and the heat source. Leave the heat on for 20-30 minutes. Remove the heat if your skin turns bright red. This is especially important if you are unable to feel pain, heat, or cold. You may have a greater risk of getting burned. Activity Do exercises as told by your health care provider. Avoid activities that cause pain. General instructions  Take over-the-counter and prescription medicines only as told by your health care provider. Keep a journal of your symptoms. Write down: How often you have hip pain. The location of your pain. What the pain feels like. What makes the pain worse. Sleep with a pillow between your legs on  your most comfortable side. Keep all follow-up visits as told by your health care provider. This is important. Contact a health care provider if: You cannot put weight on your leg. Your pain or swelling continues or gets worse after one week. It gets harder to walk. You have a fever. Get help right away if: You fall. You have a sudden increase in pain and swelling in your hip. Your hip is red or swollen or very tender to touch. Summary Hip pain can range from a minor ache to severe pain in one or both of your hips. The pain may be felt on the inside of the hip joint near the groin, or on the outside near the buttocks and upper thigh. Avoid activities that cause pain. Write down how often you have hip pain, the location of the pain, what makes it worse, and what it feels like. This information is not intended to replace advice given to you by your health care provider. Make sure you discuss any questions you have with your health care provider. Document Revised: 06/26/2018 Document Reviewed: 06/26/2018 Elsevier Patient Education  [place].

## 2021-08-21 NOTE — Progress Notes (Signed)
Acute Office Visit  Subjective:     Patient ID: Kristin Powell, female    DOB: December 12, 1965, 56 y.o.   MRN: [id number]  Chief Complaint  Patient presents with   Hip Pain    Hip , leg and foot pain    Hip Pain  The incident occurred 2 days ago. The incident occurred at work. There was no injury mechanism. The pain is present in the right foot and left foot. The pain is moderate. The pain has been Fluctuating since onset. Pertinent negatives include no loss of motion, numbness or tingling. She reports no foreign bodies present. Nothing aggravates the symptoms.  Dysuria  This is a new problem. The current episode started yesterday. The problem occurs every urination. The problem has been unchanged. The quality of the pain is described as aching. There has been no fever. Pertinent negatives include no chills, discharge, flank pain, frequency or nausea. She has tried nothing for the symptoms.  Otalgia  There is pain in the right ear. This is a new problem. The current episode started yesterday. The problem occurs constantly. The problem has been gradually improving. There has been no fever. The pain is mild. Pertinent negatives include no rash. She has tried nothing for the symptoms. There is no history of a chronic ear infection or hearing loss.     Review of Systems  Constitutional: Negative.  Negative for chills.  HENT:  Positive for ear pain.   Gastrointestinal:  Negative for nausea.  Genitourinary:  Positive for dysuria. Negative for flank pain and frequency.  Musculoskeletal:  Positive for back pain and joint pain.  Skin: Negative.  Negative for itching and rash.  Neurological:  Negative for tingling and numbness.  All other systems reviewed and are negative.       Objective:    BP 133/87   Pulse 69   Temp 98.8 F (37.1 C)   Ht 5' (1.524 m)   Wt 170 lb 12.8 oz (77.5 kg)   SpO2 99%   BMI 33.36 kg/m  BP Readings from Last 3 Encounters:  08/21/21 133/87  08/08/20 116/71   03/01/19 113/69   Wt Readings from Last 3 Encounters:  08/21/21 170 lb 12.8 oz (77.5 kg)  08/08/20 163 lb (73.9 kg)  03/01/19 172 lb 9.6 oz (78.3 kg)      Physical Exam Vitals reviewed.  Constitutional:      Appearance: Normal appearance.  HENT:     Head: Normocephalic.     Right Ear: External ear normal.     Left Ear: External ear normal.     Nose: Nose normal.     Mouth/Throat:     Mouth: Mucous membranes are moist.  Eyes:     Conjunctiva/sclera: Conjunctivae normal.  Cardiovascular:     Rate and Rhythm: Normal rate and regular rhythm.  Musculoskeletal:        General: Normal range of motion.     Lumbar back: Tenderness present.     Right foot: Normal range of motion. Tenderness present. No swelling, deformity or bunion.     Left foot: Normal range of motion. Tenderness present. No swelling, deformity or bunion.       Legs:     Comments: Bilateral heal pain   Neurological:     General: No focal deficit present.     Mental Status: She is alert and oriented to person, place, and time.  Psychiatric:        Mood and Affect: Mood normal.  Behavior: Behavior normal.     No results found for any visits on 08/21/21.      Assessment & Plan:   UTI  vs  interstitial cystitis -urinalysis completed results pending -pyridium for pain Precaution and education provided -All questions answered -follow up with unresolved symptoms   For back pain and leg pain, patient reports working 12 hours on her feet without good insoles or foot wear.  Recommendation- good supportive shoes with insoles, tylenol/ibuprofen for pain, warm soaks and elevating feet.   For ear pain, no ear infection assessed. No fever, mastoid tenderness, fluid filled canal or discharge. Advised patient to follow up with worsening unresolved symptoms. Problem List Items Addressed This Visit   None Visit Diagnoses     Dysuria    -  Primary   Relevant Orders   Urinalysis, Routine w reflex  microscopic   Right ear pain       Right hip pain       Relevant Medications   acetaminophen (TYLENOL) 500 MG tablet   predniSONE (DELTASONE) 20 MG tablet       Meds ordered this encounter  Medications   acetaminophen (TYLENOL) 500 MG tablet    Sig: Take 1 tablet (500 mg total) by mouth every 6 (six) hours as needed.    Dispense:  30 tablet    Refill:  0    Order Specific Question:   Supervising Provider    Answer:   Jeneen Rinks   predniSONE (DELTASONE) 20 MG tablet    Sig: Take 1 tablet (20 mg total) by mouth daily with breakfast.    Dispense:  6 tablet    Refill:  0    Order Specific Question:   Supervising Provider    Answer:   Jeneen Rinks    Return if symptoms worsen or fail to improve.  Ivy Lynn, NP

## 2021-08-27 ENCOUNTER — Ambulatory Visit
Admission: RE | Admit: 2021-08-27 | Discharge: 2021-08-27 | Disposition: A | Discharge status: Home / Self Care | Payer: BC Managed Care – PPO | Source: Ambulatory Visit | Attending: Nurse Practitioner | Admitting: Nurse Practitioner

## 2021-08-27 DIAGNOSIS — Z1231 Encounter for screening mammogram for malignant neoplasm of breast: Secondary | ICD-10-CM

## 2021-09-18 ENCOUNTER — Encounter: Payer: Self-pay | Admitting: Nurse Practitioner

## 2021-09-18 ENCOUNTER — Ambulatory Visit: Payer: BC Managed Care – PPO | Admitting: Nurse Practitioner

## 2021-10-13 ENCOUNTER — Ambulatory Visit: Payer: BC Managed Care – PPO | Admitting: Nurse Practitioner

## 2021-10-13 ENCOUNTER — Encounter: Payer: Self-pay | Admitting: Nurse Practitioner

## 2021-10-13 ENCOUNTER — Ambulatory Visit (INDEPENDENT_AMBULATORY_CARE_PROVIDER_SITE_OTHER): Payer: BC Managed Care – PPO

## 2021-10-13 VITALS — BP 103/68 | HR 62 | Temp 97.3°F | Resp 20 | Ht 60.0 in | Wt 176.0 lb

## 2021-10-13 DIAGNOSIS — M79672 Pain in left foot: Secondary | ICD-10-CM

## 2021-10-13 DIAGNOSIS — M7732 Calcaneal spur, left foot: Secondary | ICD-10-CM | POA: Diagnosis not present

## 2021-10-13 DIAGNOSIS — M79671 Pain in right foot: Secondary | ICD-10-CM | POA: Diagnosis not present

## 2021-10-13 DIAGNOSIS — M722 Plantar fascial fibromatosis: Secondary | ICD-10-CM | POA: Diagnosis not present

## 2021-10-13 MED ORDER — CELECOXIB 200 MG PO CAPS: 200.0000 mg | ORAL_CAPSULE | Freq: Two times a day (BID) | ORAL | 2 refills | Status: DC

## 2021-10-13 NOTE — Progress Notes (Signed)
Subjective:    Patient ID: Kristin Powell, female    DOB: 1965/08/14, 56 y.o.   MRN: [id number]   Chief Complaint: Bilateral feet and leg pain and Wants thyroid checked   HPI Patient comes in today c/o bil foot pain. Worse when she is at work and standing and walking. Left heel pain- feels like she is walking on a nail. She was seen 3 weeks ago with same complaint and was prescribed prednisone which did not help.    Review of Systems  Constitutional:  Negative for diaphoresis.  Eyes:  Negative for pain.  Respiratory:  Negative for shortness of breath.   Cardiovascular:  Negative for chest pain, palpitations and leg swelling.  Gastrointestinal:  Negative for abdominal pain.  Endocrine: Negative for polydipsia.  Skin:  Negative for rash.  Neurological:  Negative for dizziness, weakness and headaches.  Hematological:  Does not bruise/bleed easily.  All other systems reviewed and are negative.      Objective:   Physical Exam Vitals and nursing note reviewed.  Constitutional:      General: She is not in acute distress.    Appearance: Normal appearance. She is well-developed.  Neck:     Vascular: No carotid bruit or JVD.  Cardiovascular:     Rate and Rhythm: Normal rate and regular rhythm.     Heart sounds: Normal heart sounds.  Pulmonary:     Effort: Pulmonary effort is normal. No respiratory distress.     Breath sounds: Normal breath sounds. No wheezing or rales.  Chest:     Chest wall: No tenderness.  Abdominal:     General: Bowel sounds are normal. There is no distension or abdominal bruit.     Palpations: Abdomen is soft. There is no hepatomegaly, splenomegaly, mass or pulsatile mass.     Tenderness: There is no abdominal tenderness.  Musculoskeletal:        General: Normal range of motion.     Cervical back: Normal range of motion and neck supple.     Comments: no heel pain on palpation No foot edema bil  Lymphadenopathy:     Cervical: No cervical adenopathy.   Skin:    General: Skin is warm and dry.  Neurological:     Mental Status: She is alert and oriented to person, place, and time.     Deep Tendon Reflexes: Reflexes are normal and symmetric.  Psychiatric:        Behavior: Behavior normal.        Thought Content: Thought content normal.        Judgment: Judgment normal.    BP 103/68   Pulse 62   Temp (!) 97.3 F (36.3 C) (Temporal)   Resp 20   Ht 5' (1.524 m)   Wt 176 lb (79.8 kg)   SpO2 100%   BMI 34.37 kg/m         Assessment & Plan:  Kristin Powell in today with chief complaint of Bilateral feet and leg pain and Wants thyroid checked   1. Bilateral foot pain - DG Foot Complete Left  2. Plantar fasciitis - celecoxib (CELEBREX) 200 MG capsule; Take 1 capsule (200 mg total) by mouth 2 (two) times daily.  Dispense: 60 capsule; Refill: 2  3. Calcaneal spur of left foot Good feet store for customs shoes Rest Ice as needed    The above assessment and management plan was discussed with the patient. The patient verbalized understanding of and has agreed to the  management plan. Patient is aware to call the clinic if symptoms persist or worsen. Patient is aware when to return to the clinic for a follow-up visit. Patient educated on when it is appropriate to go to the emergency department.   Mary-Margaret Hassell Done, FNP

## 2021-10-13 NOTE — Patient Instructions (Signed)
Espoln calcneo [place]  Los espolones calcneos son protuberancias seas que se forman en la parte inferior del hueso del taln (calcneo). Los espolones calcneos son frecuentes. A menudo causan inflamacin en fascia plantar, que es la banda de tejido que [employer] dedos con el hueso del taln. Cuando la fascia plantar se inflama, se denomina fascitis plantar. Esto puede causar dolor en la zona inferior del pie, cerca del taln. Muchas personas con fascitis plantar tambin presentan espolones calcneos. Sin embargo, los espolones no son la causa del dolor de la fascitis plantar. Cules son las causas? Se desconoce la causa exacta de los espolones calcneos. Las [employer] ser las siguientes: Presin sobre el hueso del taln. Bandas de tejido que conectan el msculo al hueso (tendones) que tiran del hueso del taln. Qu incrementa el riesgo? Es ms probable que desarrolle esta afeccin si: Tiene ms de 69 aos de edad. Tiene sobrepeso. Padece artritis debido al deterioro (artrosis). Presenta inflamacin en la fascia plantar. Participa en deportes o actividades que incluyen correr o Careers adviser. [place] zapatos que no tienen buen calce. Cules son los signos o sntomas? Algunas personas no tienen sntomas. Si tiene sntomas, pueden incluir los siguientes: Dolor en la parte inferior del taln. Dolor que empeora al levantarse de la cama por primera vez. Dolor que empeora despus de caminar o ponerse de pie. Cmo se diagnostica? Esta afeccin se puede diagnosticar en funcin de lo siguiente: Los sntomas y los antecedentes mdicos. Un examen fsico. Una radiografa del pie. Cmo se trata? El tratamiento de esta afeccin depende de cunto dolor sienta. Las opciones de tratamiento pueden incluir: Hacer ejercicios de estiramiento y perder peso, si es necesario. Usar calzados o plantillas especficos dentro del calzado (aparatos ortopdicos) para sentir comodidad y 45. Usar  frulas en los pies mientras duerme. Las frulas [employer] pies en una posicin (generalmente un ngulo de 41 grados) que debe prevenir y Best boy que siente al levantarse de la cama por primera vez. Adems, facilitan el estiramiento en la maana. Tomar medicamentos de venta libre para Best boy, como antiinflamatorios no esteroideos ([place]). Utilizar ondas sonoras de alta intensidad para fragmentar el espoln calcneo (terapia extracorprea por ondas de choque). Recibir inyecciones de corticoesteroides en el taln para reducir la inflamacin. Someterse a Civil engineer, contracting calcneo causa dolor a Barrister's clerk (crnico). Siga estas instrucciones en su casa:  Actividad Evite las actividades que le causen dolor hasta que se recupere o durante el tiempo que le haya dicho el mdico. Haga los ejercicios de estiramiento como se le ha indicado. Elongue antes de hacer ejercicio o actividad fsica. Control del dolor, la rigidez y la hinchazn Si se lo indican, aplquese hielo [employer]. Para hacer esto: Ponga el hielo en una bolsa plstica. Coloque una toalla entre la piel y Therapist, nutritional. Aplique el hielo durante 20 minutos, 2 o 3 veces por da. Retire el hielo si la piel se pone de color rojo brillante. Esto es [employer]. Si no puede sentir dolor, calor o fro, tiene un mayor riesgo de que se dae la zona. Mueva los dedos del pie con frecuencia para reducir la rigidez y la hinchazn. Cuando sea posible, levante (eleve) el pie por encima del nivel del corazn mientras est sentado o acostado. Instrucciones generales Use los medicamentos de venta libre y los recetados solamente como se lo haya indicado el mdico. Use zapatos con buen apoyo y buen calce. Use las frulas, plantillas o aparatos ortopdicos  como se lo haya indicado el mdico. Si se lo recomiendan, consulte a su mdico para bajar de peso. Esto puede aliviar la presin en el pie. No consuma ningn producto que contenga  nicotina o tabaco, como cigarrillos, cigarrillos electrnicos y tabaco de Higher education careers adviser. Estos pueden retrasar la consolidacin del [place]. Si necesita ayuda para dejar de fumar, consulte al mdico. Cumpla con todas las visitas de seguimiento. Esto es importante. Dnde buscar ms informacin American Academy of Orthopaedic Surgeons ([place]): www.orthoinfo.aaos.org Comunquese con un mdico si: El dolor no desaparece con Dispensing optician. El dolor [place]. Resumen Los espolones calcneos son protuberancias seas que se forman en la parte inferior del hueso del taln (calcneo). Los espolones calcneos suelen causar inflamacin en la fascia plantar, que es la banda de tejido que [employer] dedos del pie con el hueso del taln. Esto puede causar dolor en la zona inferior del pie, cerca del taln. Hacer ejercicios de elongacin, bajar de peso, usar zapatos o [place], usar frulas [employer] duerme y tomar analgsicos puede Best boy y la rigidez. Otras opciones de tratamiento pueden incluir ondas sonoras de alta intensidad para fragmentar el espoln calcneo, inyecciones con corticoesteroides y [place]. Esta informacin no tiene Marine scientist el consejo del mdico. Asegrese de hacerle al mdico cualquier pregunta que tenga. Document Revised: 08/07/2019 Document Reviewed: 08/07/2019 Elsevier Patient Education  [place].

## 2021-10-23 ENCOUNTER — Encounter: Payer: Self-pay | Admitting: Family Medicine

## 2021-10-23 ENCOUNTER — Ambulatory Visit: Payer: BC Managed Care – PPO | Admitting: Family Medicine

## 2021-10-23 VITALS — BP 120/71 | HR 73 | Temp 98.7°F | Ht 60.0 in | Wt 177.0 lb

## 2021-10-23 DIAGNOSIS — R300 Dysuria: Secondary | ICD-10-CM | POA: Diagnosis not present

## 2021-10-23 DIAGNOSIS — N898 Other specified noninflammatory disorders of vagina: Secondary | ICD-10-CM

## 2021-10-23 DIAGNOSIS — B3731 Acute candidiasis of vulva and vagina: Secondary | ICD-10-CM | POA: Diagnosis not present

## 2021-10-23 DIAGNOSIS — N3000 Acute cystitis without hematuria: Secondary | ICD-10-CM | POA: Diagnosis not present

## 2021-10-23 LAB — MICROSCOPIC EXAMINATION
Bacteria, UA: NONE SEEN
RBC, Urine: NONE SEEN /hpf (ref 0–2)
Renal Epithel, UA: NONE SEEN /hpf

## 2021-10-23 LAB — WET PREP FOR TRICH, YEAST, CLUE
Clue Cell Exam: NEGATIVE
Trichomonas Exam: NEGATIVE
Yeast Exam: NEGATIVE

## 2021-10-23 LAB — URINALYSIS, ROUTINE W REFLEX MICROSCOPIC
Bilirubin, UA: NEGATIVE
Glucose, UA: NEGATIVE
Ketones, UA: NEGATIVE
Leukocytes,UA: NEGATIVE
Nitrite, UA: NEGATIVE
Protein,UA: NEGATIVE
Specific Gravity, UA: 1.015 (ref 1.005–1.030)
Urobilinogen, Ur: 0.2 mg/dL (ref 0.2–1.0)
pH, UA: 7 (ref 5.0–7.5)

## 2021-10-23 MED ORDER — FLUCONAZOLE 150 MG PO TABS: 150.0000 mg | ORAL_TABLET | Freq: Once | ORAL | 0 refills | Status: AC

## 2021-10-23 NOTE — Progress Notes (Signed)
Subjective:  Patient ID: Kristin Powell, female    DOB: 06/30/1965, 56 y.o.   MRN: [id number]  Patient Care Team: Chevis Pretty, FNP as PCP - General (Family Medicine)   Chief Complaint:  vaginal irritation   HPI: Kristin Powell is a 56 y.o. female presenting on 10/23/2021 for vaginal irritation   Pt presents today with complaints of dysuria, white vaginal discharge, and vaginal irritation.   Dysuria  This is a new problem. The current episode started in the past 7 days. The problem has been waxing and waning. The pain is mild. There has been no fever. Associated symptoms include a discharge. Pertinent negatives include no chills, flank pain, frequency, hematuria, hesitancy, nausea, possible pregnancy, sweats, urgency or vomiting. She has tried increased fluids for the symptoms. The treatment provided no relief.  Vaginal Discharge The patient's primary symptoms include genital itching and vaginal discharge. The patient's pertinent negatives include no genital lesions, genital odor, genital rash, missed menses, pelvic pain or vaginal bleeding. This is a new problem. The current episode started in the past 7 days. The pain is mild. She is not pregnant. Associated symptoms include dysuria. Pertinent negatives include no abdominal pain, anorexia, back pain, chills, constipation, diarrhea, discolored urine, fever, flank pain, frequency, headaches, hematuria, joint pain, joint swelling, nausea, painful intercourse, rash, sore throat, urgency or vomiting. The vaginal discharge was thick and white. There has been no bleeding.    Relevant past medical, surgical, family, and social history reviewed and updated as indicated.  Allergies and medications reviewed and updated. Data reviewed: Chart in Epic.   Past Medical History:  Diagnosis Date   Dysuria    Hypertrophy of uterus    Rosacea    Thyroid disease     Past Surgical History:  Procedure Laterality Date   BREAST LUMPECTOMY   2005   growth removed from left breast    Social History   Socioeconomic History   Marital status: Single    Spouse name: Not on file   Number of children: Not on file   Years of education: Not on file   Highest education level: Not on file  Occupational History   Not on file  Tobacco Use   Smoking status: Former    Types: Cigarettes    Quit date: 01/06/1997    Years since quitting: 24.8   Smokeless tobacco: Never  Vaping Use   Vaping Use: Never used  Substance and Sexual Activity   Alcohol use: No   Drug use: No   Sexual activity: Never  Other Topics Concern   Not on file  Social History Narrative   Not on file   Social Determinants of Health   Financial Resource Strain: Not on file  Food Insecurity: Not on file  Transportation Needs: Not on file  Physical Activity: Not on file  Stress: Not on file  Social Connections: Not on file  Intimate Partner Violence: Not on file    Outpatient Encounter Medications as of 10/23/2021  Medication Sig   acetaminophen (TYLENOL) 500 MG tablet Take 1 tablet (500 mg total) by mouth every 6 (six) hours as needed.   celecoxib (CELEBREX) 200 MG capsule Take 1 capsule (200 mg total) by mouth 2 (two) times daily.   fluconazole (DIFLUCAN) 150 MG tablet Take 1 tablet (150 mg total) by mouth once for 1 dose.   levothyroxine (EUTHYROX) 75 MCG tablet TAKE 1 TABLET BY MOUTH ONCE DAILY IN THE MORNING ON AN EMPTY STOMACH for  90 days   levothyroxine (SYNTHROID) 50 MCG tablet Take 1 tablet (50 mcg total) by mouth daily.   Multiple Vitamin (MULTIVITAMIN PO) Take by mouth.   tretinoin (RETIN-A) 0.025 % cream Apply 1 application topically at bedtime.   Cholecalciferol (VITAMIN D) 50 MCG (2000 UT) CAPS 1 capsule Orally Once a day for 30 day(s)   No facility-administered encounter medications on file as of 10/23/2021.    Allergies  Allergen Reactions   Mobic [Meloxicam] Palpitations    Review of Systems  Constitutional:  Negative for activity  change, appetite change, chills, diaphoresis, fatigue, fever and unexpected weight change.  HENT: Negative.  Negative for sore throat.   Eyes: Negative.  Negative for photophobia and visual disturbance.  Respiratory:  Negative for cough, chest tightness and shortness of breath.   Cardiovascular:  Negative for chest pain, palpitations and leg swelling.  Gastrointestinal:  Negative for abdominal pain, anorexia, blood in stool, constipation, diarrhea, nausea and vomiting.  Endocrine: Negative.  Negative for polydipsia, polyphagia and polyuria.  Genitourinary:  Positive for dysuria and vaginal discharge. Negative for decreased urine volume, difficulty urinating, enuresis, flank pain, frequency, genital sores, hematuria, hesitancy, menstrual problem, missed menses, pelvic pain, urgency, vaginal bleeding and vaginal pain.       Vaginal pruritis  Musculoskeletal:  Negative for arthralgias, back pain, joint pain and myalgias.  Skin: Negative.  Negative for rash.  Allergic/Immunologic: Negative.   Neurological:  Negative for dizziness, tremors, seizures, syncope, facial asymmetry, speech difficulty, weakness, light-headedness, numbness and headaches.  Hematological: Negative.   Psychiatric/Behavioral:  Negative for confusion, hallucinations, sleep disturbance and suicidal ideas.   All other systems reviewed and are negative.       Objective:  BP 120/71   Pulse 73   Temp 98.7 F (37.1 C)   Ht 5' (1.524 m)   Wt 177 lb (80.3 kg)   SpO2 98%   BMI 34.57 kg/m    Wt Readings from Last 3 Encounters:  10/23/21 177 lb (80.3 kg)  10/13/21 176 lb (79.8 kg)  08/21/21 170 lb 12.8 oz (77.5 kg)    Physical Exam Vitals and nursing note reviewed.  Constitutional:      General: She is not in acute distress.    Appearance: Normal appearance. She is obese. She is not ill-appearing, toxic-appearing or diaphoretic.  HENT:     Head: Normocephalic and atraumatic.     Mouth/Throat:     Mouth: Mucous  membranes are moist.  Eyes:     Pupils: Pupils are equal, round, and reactive to light.  Cardiovascular:     Rate and Rhythm: Normal rate and regular rhythm.     Heart sounds: Normal heart sounds.  Pulmonary:     Effort: Pulmonary effort is normal.     Breath sounds: Normal breath sounds.  Abdominal:     Tenderness: There is no right CVA tenderness or left CVA tenderness.  Genitourinary:    Comments: Deferred - did self wet prep Musculoskeletal:     Right lower leg: No edema.     Left lower leg: No edema.  Skin:    General: Skin is warm and dry.     Capillary Refill: Capillary refill takes less than 2 seconds.  Neurological:     General: No focal deficit present.     Mental Status: She is alert and oriented to person, place, and time.  Psychiatric:        Mood and Affect: Mood normal.  Behavior: Behavior normal.        Thought Content: Thought content normal.        Judgment: Judgment normal.     Results for orders placed or performed in visit on 08/21/21  Microscopic Examination   Urine  Result Value Ref Range   WBC, UA None seen 0 - 5 /hpf   RBC, Urine 0-2 0 - 2 /hpf   Epithelial Cells (non renal) None seen 0 - 10 /hpf   Renal Epithel, UA None seen None seen /hpf   Bacteria, UA None seen None seen/Few  Urinalysis, Routine w reflex microscopic  Result Value Ref Range   Specific Gravity, UA 1.010 1.005 - 1.030   pH, UA 5.0 5.0 - 7.5   Color, UA Yellow Yellow   Appearance Ur Clear Clear   Leukocytes,UA Negative Negative   Protein,UA Negative Negative/Trace   Glucose, UA Negative Negative   Ketones, UA Negative Negative   RBC, UA Trace (A) Negative   Bilirubin, UA Negative Negative   Urobilinogen, Ur 0.2 0.2 - 1.0 mg/dL   Nitrite, UA Negative Negative   Microscopic Examination See below:        Pertinent labs & imaging results that were available during my care of the patient were reviewed by me and considered in my medical decision making.  Assessment &  Plan:  Kristin Powell was seen today for vaginal irritation.  Diagnoses and all orders for this visit:  Vaginal irritation Candidal vaginitis Wet prep unremarkable in office. Pt unsure if she collected appropriately. Symptomology consistent with candidal infection. Will treat as such. Vaginal hygiene discussed in detail. Report new, worsening, or persistent symptoms.  -     fluconazole (DIFLUCAN) 150 MG tablet; Take 1 tablet (150 mg total) by mouth once for 1 dose. -     WET PREP FOR TRICH, YEAST, CLUE  Dysuria Urinalysis unremarkable in office. Culture pending. Will treat if warranted. Increase water intake and avoid bladder irritants.  -     Urinalysis, Routine w reflex microscopic -     Urine Culture      Continue all other maintenance medications.  Follow up plan: Return if symptoms worsen or fail to improve.   Continue healthy lifestyle choices, including diet (rich in fruits, vegetables, and lean proteins, and low in salt and simple carbohydrates) and exercise (at least 30 minutes of moderate physical activity daily).  Educational handout given for healthy vaginal practices  The above assessment and management plan was discussed with the patient. The patient verbalized understanding of and has agreed to the management plan. Patient is aware to call the clinic if they develop any new symptoms or if symptoms persist or worsen. Patient is aware when to return to the clinic for a follow-up visit. Patient educated on when it is appropriate to go to the emergency department.   Monia Pouch, FNP-C [facility] [phone]

## 2021-10-23 NOTE — Patient Instructions (Signed)
Probiotic Yogurt Rephresh  Healthy vaginal hygiene practices   -  Avoid sleeper pajamas. Nightgowns allow air to circulate.  Sleep without underpants whenever possible.  -  Wear cotton underpants during the day. Double-rinse underwear after washing to avoid residual irritants. Do not use fabric softeners for underwear and swimsuits.  - Avoid tights, leotards, leggings, "skinny" jeans, and other tight-fitting clothing. Skirts and loose-fitting pants allow air to circulate.  - Avoid pantyliners.  Instead use tampons or cotton pads.  - Daily warm bathing is helpful:     - Soak in clean water (no soap) for 10 to 15 minutes.     - Use soap to wash regions other than the genital area just before getting out of the tub or drain water and take a shower to wash your body. Limit use of any soap on genital areas. Use   fragance-free soaps.     - Rinse the genital area well and gently pat dry.  Don't rub.  Hair dryer to assist with drying can be used only if on cool setting.     - Do not use bubble baths or perfumed soaps.  - Do not use any feminine sprays, douches or powders.  These contain chemicals that will irritate the skin.  - If the genital area is tender or swollen, cool compresses may relieve the discomfort. Unscented wet wipes can be used instead of toilet paper for wiping.   - Emollients, such as Vaseline, may help protect skin and can be applied to the irritated area.  - Always remember to wipe front-to-back after bowel movements. Pat dry after urination.  - Do not sit in wet swimsuits for long periods of time after swimming

## 2021-10-27 LAB — URINE CULTURE

## 2021-10-27 MED ORDER — AMOXICILLIN-POT CLAVULANATE 875-125 MG PO TABS: 1.0000 | ORAL_TABLET | Freq: Two times a day (BID) | ORAL | 0 refills | Status: AC

## 2021-10-27 NOTE — Addendum Note (Signed)
Addended by: Baruch Gouty on: 10/27/2021 04:11 PM   Modules accepted: Orders

## 2022-01-29 ENCOUNTER — Other Ambulatory Visit: Payer: Self-pay | Admitting: Nurse Practitioner

## 2022-01-29 DIAGNOSIS — M25551 Pain in right hip: Secondary | ICD-10-CM

## 2022-04-16 ENCOUNTER — Encounter: Payer: Self-pay | Admitting: Nurse Practitioner

## 2022-04-16 ENCOUNTER — Ambulatory Visit: Payer: BC Managed Care – PPO | Admitting: Nurse Practitioner

## 2022-04-16 VITALS — BP 119/80 | HR 66 | Temp 97.4°F | Resp 20 | Ht 60.0 in | Wt 176.0 lb

## 2022-04-16 DIAGNOSIS — M79604 Pain in right leg: Secondary | ICD-10-CM

## 2022-04-16 DIAGNOSIS — M79605 Pain in left leg: Secondary | ICD-10-CM | POA: Diagnosis not present

## 2022-04-16 MED ORDER — PREDNISONE 20 MG PO TABS: ORAL_TABLET | ORAL | 0 refills | Status: DC

## 2022-04-16 MED ORDER — METHYLPREDNISOLONE ACETATE 80 MG/ML IJ SUSP
80.0000 mg | Freq: Once | INTRAMUSCULAR | Status: AC
  Administered 2022-04-16: 80 mg via INTRAMUSCULAR

## 2022-04-16 NOTE — Progress Notes (Signed)
Subjective:    Patient ID: Kristin Powell, female    DOB: December 25, 1965, 57 y.o.   MRN: [id number]   Chief Complaint: Pain in entire body   HPI Patient in c/o pain from waist down. It is her hips and knees. 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 Some days are worse then others. She has taken celebrex and that causes her to be very forgetful. She rates her pain 5/10 today. Some days are worse then others.   Patient Active Problem List   Diagnosis Date Noted   Anxiety 06/23/2017   Uterine leiomyoma 06/23/2017   Hypothyroidism 06/05/2012       Review of Systems  Constitutional:  Negative for diaphoresis.  Eyes:  Negative for pain.  Respiratory:  Negative for shortness of breath.   Cardiovascular:  Negative for chest pain, palpitations and leg swelling.  Gastrointestinal:  Negative for abdominal pain.  Endocrine: Negative for polydipsia.  Skin:  Negative for rash.  Neurological:  Negative for dizziness, weakness and headaches.  Hematological:  Does not bruise/bleed easily.  All other systems reviewed and are negative.      Objective:   Physical Exam Vitals and nursing note reviewed.  Constitutional:      General: She is not in acute distress.    Appearance: Normal appearance. She is well-developed.  Neck:     Vascular: No carotid bruit or JVD.  Cardiovascular:     Rate and Rhythm: Normal rate and regular rhythm.     Heart sounds: Normal heart sounds.  Pulmonary:     Effort: Pulmonary effort is normal. No respiratory distress.     Breath sounds: Normal breath sounds. No wheezing or rales.  Chest:     Chest wall: No tenderness.  Abdominal:     General: Bowel sounds are normal. There is no distension or abdominal bruit.     Palpations: Abdomen is soft. There is no hepatomegaly, splenomegaly, mass or pulsatile mass.     Tenderness: There is no abdominal tenderness.  Musculoskeletal:        General: Normal range of motion.     Cervical back:  Normal range of motion and neck supple.     Comments: From of bil hips and knees with pain on weight bearing  Lymphadenopathy:     Cervical: No cervical adenopathy.  Skin:    General: Skin is warm and dry.  Neurological:     Mental Status: She is alert and oriented to person, place, and time.     Deep Tendon Reflexes: Reflexes are normal and symmetric.  Psychiatric:        Behavior: Behavior normal.        Thought Content: Thought content normal.        Judgment: Judgment normal.     BP 119/80   Pulse 66   Temp (!) 97.4 F (36.3 C) (Temporal)   Resp 20   Ht 5' (1.524 m)   Wt 176 lb (79.8 kg)   SpO2 100%   BMI 34.37 kg/m        Assessment & Plan:   Kristin Powell in today with chief complaint of Pain in entire body   1. Pain in both lower extremities Moist heat' rest RTO prn - Arthritis Panel - methylPREDNISolone acetate (DEPO-MEDROL) injection 80 mg - predniSONE (DELTASONE) 20 MG tablet; 2 po at sametime daily for 5 days-  Dispense: 10 tablet; Refill: 0    The above assessment  and management plan was discussed with the patient. The patient verbalized understanding of and has agreed to the management plan. Patient is aware to call the clinic if symptoms persist or worsen. Patient is aware when to return to the clinic for a follow-up visit. Patient educated on when it is appropriate to go to the emergency department.   Mary-Margaret Hassell Done, FNP

## 2022-04-16 NOTE — Patient Instructions (Signed)
Hip Pain The hip is the joint between the upper legs and the lower pelvis. The bones, cartilage, tendons, and muscles of your hip joint support your body and allow you to move around. Hip pain can range from a minor ache to severe pain in one or both of your hips. The pain may be felt on the inside of the hip joint near the groin, or on the outside near the buttocks and upper thigh. You may also have swelling or stiffness in your hip area. Follow these instructions at home: Managing pain, stiffness, and swelling     If told, put ice on the painful area. Put ice in a plastic bag. Place a towel between your skin and the bag. Leave the ice on for 20 minutes, 2-3 times a day. If told, apply heat to the affected area as often as told by your health care provider. Use the heat source that your provider recommends, such as a moist heat pack or a heating pad. Place a towel between your skin and the heat source. Leave the heat on for 20-30 minutes. If your skin turns bright red, remove the ice or heat right away to prevent skin damage. The risk of damage is higher if you cannot feel pain, heat, or cold. Activity Do exercises as told by your provider. Avoid activities that cause pain. General instructions  Take over-the-counter and prescription medicines only as told by your provider. Keep a journal of your symptoms. Write down: How often you have hip pain. The location of your pain. What the pain feels like. What makes the pain worse. Sleep with a pillow between your legs on your most comfortable side. Keep all follow-up visits. Your provider will monitor your pain and activity. Contact a health care provider if: You cannot put weight on your leg. Your pain or swelling gets worse after a week. It gets harder to walk. You have a fever. Get help right away if: You fall. You have a sudden increase in pain and swelling in your hip. Your hip is red or swollen or very tender to touch. This  information is not intended to replace advice given to you by your health care provider. Make sure you discuss any questions you have with your health care provider. Document Revised: 10/13/2021 Document Reviewed: 10/13/2021 Elsevier Patient Education  [place].

## 2022-04-17 LAB — ARTHRITIS PANEL
Basophils Absolute: 0.1 10*3/uL (ref 0.0–0.2)
Basos: 1 %
EOS (ABSOLUTE): 0.1 10*3/uL (ref 0.0–0.4)
Eos: 1 %
Hematocrit: 38.6 % (ref 34.0–46.6)
Hemoglobin: 13.1 g/dL (ref 11.1–15.9)
Immature Grans (Abs): 0 10*3/uL (ref 0.0–0.1)
Immature Granulocytes: 0 %
Lymphocytes Absolute: 1.7 10*3/uL (ref 0.7–3.1)
Lymphs: 32 %
MCH: 30.6 pg (ref 26.6–33.0)
MCHC: 33.9 g/dL (ref 31.5–35.7)
MCV: 90 fL (ref 79–97)
Monocytes Absolute: 0.5 10*3/uL (ref 0.1–0.9)
Monocytes: 9 %
Neutrophils Absolute: 3.1 10*3/uL (ref 1.4–7.0)
Neutrophils: 57 %
Platelets: 216 10*3/uL (ref 150–450)
RBC: 4.28 x10E6/uL (ref 3.77–5.28)
RDW: 12.3 % (ref 11.7–15.4)
Rheumatoid fact SerPl-aCnc: <10 IU/mL (ref <14.0)
Sed Rate: 2 mm/hr (ref 0–40)
Uric Acid: 4.6 mg/dL (ref 3.0–7.2)
WBC: 5.4 10*3/uL (ref 3.4–10.8)

## 2022-09-15 ENCOUNTER — Other Ambulatory Visit: Payer: Self-pay | Admitting: Nurse Practitioner

## 2022-09-15 DIAGNOSIS — Z1231 Encounter for screening mammogram for malignant neoplasm of breast: Secondary | ICD-10-CM

## 2022-09-27 ENCOUNTER — Ambulatory Visit
Admission: RE | Admit: 2022-09-27 | Discharge: 2022-09-27 | Disposition: A | Discharge status: Home / Self Care | Payer: BC Managed Care – PPO | Source: Ambulatory Visit | Attending: Nurse Practitioner | Admitting: Nurse Practitioner

## 2022-09-27 DIAGNOSIS — Z1231 Encounter for screening mammogram for malignant neoplasm of breast: Secondary | ICD-10-CM

## 2022-10-26 ENCOUNTER — Other Ambulatory Visit (HOSPITAL_COMMUNITY)
Admission: RE | Admit: 2022-10-26 | Discharge: 2022-10-26 | Disposition: A | Discharge status: Home / Self Care | Payer: BC Managed Care – PPO | Source: Ambulatory Visit | Attending: Nurse Practitioner | Admitting: Nurse Practitioner

## 2022-10-26 ENCOUNTER — Encounter: Payer: Self-pay | Admitting: Nurse Practitioner

## 2022-10-26 ENCOUNTER — Ambulatory Visit (INDEPENDENT_AMBULATORY_CARE_PROVIDER_SITE_OTHER): Payer: BC Managed Care – PPO | Admitting: Nurse Practitioner

## 2022-10-26 VITALS — BP 122/77 | HR 66 | Temp 97.5°F | Resp 20 | Ht 60.0 in | Wt 175.0 lb

## 2022-10-26 DIAGNOSIS — E559 Vitamin D deficiency, unspecified: Secondary | ICD-10-CM

## 2022-10-26 DIAGNOSIS — Z0001 Encounter for general adult medical examination with abnormal findings: Secondary | ICD-10-CM | POA: Diagnosis not present

## 2022-10-26 DIAGNOSIS — Z6834 Body mass index (BMI) 34.0-34.9, adult: Secondary | ICD-10-CM

## 2022-10-26 DIAGNOSIS — Z23 Encounter for immunization: Secondary | ICD-10-CM

## 2022-10-26 DIAGNOSIS — Z0000 Encounter for general adult medical examination without abnormal findings: Secondary | ICD-10-CM | POA: Insufficient documentation

## 2022-10-26 DIAGNOSIS — E039 Hypothyroidism, unspecified: Secondary | ICD-10-CM

## 2022-10-26 DIAGNOSIS — F419 Anxiety disorder, unspecified: Secondary | ICD-10-CM | POA: Diagnosis not present

## 2022-10-26 NOTE — Patient Instructions (Signed)
Exercising to Stay Healthy To become healthy and stay healthy, it is recommended that you do moderate-intensity and vigorous-intensity exercise. You can tell that you are exercising at a moderate intensity if your heart starts beating faster and you start breathing faster but can still hold a conversation. You can tell that you are exercising at a vigorous intensity if you are breathing much harder and faster and cannot hold a conversation while exercising. How can exercise benefit me? Exercising regularly is important. It has many health benefits, such as: Improving overall fitness, flexibility, and endurance. Increasing bone density. Helping with weight control. Decreasing body fat. Increasing muscle strength and endurance. Reducing stress and tension, anxiety, depression, or anger. Improving overall health. What guidelines should I follow while exercising? Before you start a new exercise program, talk with your health care provider. Do not exercise so much that you hurt yourself, feel dizzy, or get very short of breath. Wear comfortable clothes and wear shoes with good support. Drink plenty of water while you exercise to prevent dehydration or heat stroke. Work out until your breathing and your heartbeat get faster (moderate intensity). How often should I exercise? Choose an activity that you enjoy, and set realistic goals. Your health care provider can help you make an activity plan that is individually designed and works best for you. Exercise regularly as told by your health care provider. This may include: Doing strength training two times a week, such as: Lifting weights. Using resistance bands. Push-ups. Sit-ups. Yoga. Doing a certain intensity of exercise for a given amount of time. Choose from these options: A total of 150 minutes of moderate-intensity exercise every week. A total of 75 minutes of vigorous-intensity exercise every week. A mix of moderate-intensity and  vigorous-intensity exercise every week. Children, pregnant women, people who have not exercised regularly, people who are overweight, and older adults may need to talk with a health care provider about what activities are safe to perform. If you have a medical condition, be sure to talk with your health care provider before you start a new exercise program. What are some exercise ideas? Moderate-intensity exercise ideas include: Walking 1 mile (1.6 km) in about 15 minutes. Biking. Hiking. Golfing. Dancing. Water aerobics. Vigorous-intensity exercise ideas include: Walking 4.5 miles (7.2 km) or more in about 1 hour. Jogging or running 5 miles (8 km) in about 1 hour. Biking 10 miles (16.1 km) or more in about 1 hour. Lap swimming. Roller-skating or in-line skating. Cross-country skiing. Vigorous competitive sports, such as football, basketball, and soccer. Jumping rope. Aerobic dancing. What are some everyday activities that can help me get exercise? Yard work, such as: Pushing a lawn mower. Raking and bagging leaves. Washing your car. Pushing a stroller. Shoveling snow. Gardening. Washing windows or floors. How can I be more active in my day-to-day activities? Use stairs instead of an elevator. Take a walk during your lunch break. If you drive, park your car farther away from your work or school. If you take public transportation, get off one stop early and walk the rest of the way. Stand up or walk around during all of your indoor phone calls. Get up, stretch, and walk around every 30 minutes throughout the day. Enjoy exercise with a friend. Support to continue exercising will help you keep a regular routine of activity. Where to find more information You can find more information about exercising to stay healthy from: U.S. Department of Health and Human Services: www.hhs.gov Centers for Disease Control and Prevention (  CDC): www.cdc.gov Summary Exercising regularly is  important. It will improve your overall fitness, flexibility, and endurance. Regular exercise will also improve your overall health. It can help you control your weight, reduce stress, and improve your bone density. Do not exercise so much that you hurt yourself, feel dizzy, or get very short of breath. Before you start a new exercise program, talk with your health care provider. This information is not intended to replace advice given to you by your health care provider. Make sure you discuss any questions you have with your health care provider. Document Revised: 06/06/2020 Document Reviewed: 06/06/2020 Elsevier Patient Education  2024 [employer]vier Inc.  

## 2022-10-26 NOTE — Progress Notes (Signed)
Subjective:    Patient ID: Kristin Powell, female    DOB: [birth year], 57 y.o.   MRN: [id number]   Chief Complaint: medical management of chronic issues     HPI:  Kristin Powell is a 57 y.o. who identifies as a female who was assigned female at birth.   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 Annual physical exam Pap today  2. Acquired hypothyroidism No issues that she is aware of. Lab Results  Component Value Date   TSH 1.970 03/01/2019     3. Anxiety Is on no meds for this    10/26/2022   10:39 AM 04/16/2022    3:11 PM 10/23/2021   10:45 AM 10/13/2021    9:02 AM  GAD 7 : Generalized Anxiety Score  Nervous, Anxious, on Edge 0 0 0 0  Control/stop worrying 0 0 0 0  Worry too much - different things 0 0 0 0  Trouble relaxing 0 0 0 0  Restless 0 0 0 0  Easily annoyed or irritable 0 0 0 0  Afraid - awful might happen 0 0 0 0  Total GAD 7 Score 0 0 0 0  Anxiety Difficulty Not difficult at all Not difficult at all Not difficult at all Not difficult at all      4. Vitamin d deficiency Is on daily supplement Last vitamin D Lab Results  Component Value Date   VD25OH 28.8 (L) 12/17/2014     5. BMI 34.0-34.9,adult No recent weight changes Wt Readings from Last 3 Encounters:  10/26/22 175 lb (79.4 kg)  04/16/22 176 lb (79.8 kg)  10/23/21 177 lb (80.3 kg)   BMI Readings from Last 3 Encounters:  10/26/22 34.18 kg/m  04/16/22 34.37 kg/m  10/23/21 34.57 kg/m     New complaints: None today  Allergies  Allergen Reactions   Mobic [Meloxicam] Palpitations   Outpatient Encounter Medications as of 10/26/2022  Medication Sig   Cholecalciferol (VITAMIN D) 50 MCG (2000 UT) CAPS 1 capsule Orally Once a day for 30 day(s)   EQ ACETAMINOPHEN 500 MG tablet TAKE 1 TABLET BY MOUTH EVERY 6 HOURS AS NEEDED   levothyroxine (SYNTHROID) 50 MCG tablet Take 1 tablet (50 mcg total)  by mouth daily.   Multiple Vitamin (MULTIVITAMIN PO) Take by mouth.   predniSONE (DELTASONE) 20 MG tablet 2 po at sametime daily for 5 days-   tretinoin (RETIN-A) 0.025 % cream Apply 1 application topically at bedtime.   No facility-administered encounter medications on file as of 10/26/2022.    Past Surgical History:  Procedure Laterality Date   BREAST LUMPECTOMY  2005   growth removed from left breast    Family History  Problem Relation Age of Onset   Diabetes Mother    Diabetes Father    ALS Father    Cancer Sister        ovarian   Cancer Maternal Grandfather        bone   Cancer Paternal Grandmother    Breast cancer Neg Hx       Controlled substance contract: n/a     Review of Systems  Constitutional:  Negative for diaphoresis.  Eyes:  Negative for pain.  Respiratory:  Negative for shortness of breath.   Cardiovascular:  Negative for chest pain, palpitations and leg swelling.  Gastrointestinal:  Negative for abdominal pain.  Endocrine: Negative for polydipsia.  Skin:  Negative for rash.  Neurological:  Negative for dizziness, weakness and headaches.  Hematological:  Does not bruise/bleed easily.  All other systems reviewed and are negative.      Objective:   Physical Exam Vitals and nursing note reviewed.  Constitutional:      General: She is not in acute distress.    Appearance: Normal appearance. She is well-developed.  HENT:     Head: Normocephalic.     Right Ear: Tympanic membrane normal.     Left Ear: Tympanic membrane normal.     Nose: Nose normal.     Mouth/Throat:     Mouth: Mucous membranes are moist.  Eyes:     Pupils: Pupils are equal, round, and reactive to light.  Neck:     Vascular: No carotid bruit or JVD.  Cardiovascular:     Rate and Rhythm: Normal rate and regular rhythm.     Heart sounds: Normal heart sounds.  Pulmonary:     Effort: Pulmonary effort is normal. No respiratory distress.     Breath sounds: Normal breath sounds.  No wheezing or rales.  Chest:     Chest wall: No tenderness.  Abdominal:     General: Bowel sounds are normal. There is no distension or abdominal bruit.     Palpations: Abdomen is soft. There is no hepatomegaly, splenomegaly, mass or pulsatile mass.     Tenderness: There is no abdominal tenderness.  Genitourinary:    General: Normal vulva.     Vagina: No vaginal discharge.     Rectum: Normal.     Comments: Cervix parous and pink No adnexal mass or tenderness Musculoskeletal:        General: Normal range of motion.     Cervical back: Normal range of motion and neck supple.  Lymphadenopathy:     Cervical: No cervical adenopathy.  Skin:    General: Skin is warm and dry.  Neurological:     Mental Status: She is alert and oriented to person, place, and time.     Deep Tendon Reflexes: Reflexes are normal and symmetric.  Psychiatric:        Behavior: Behavior normal.        Thought Content: Thought content normal.        Judgment: Judgment normal.    BP 122/77   Pulse 66   Temp (!) 97.5 F (36.4 C) (Temporal)   Resp 20   Ht 5' (1.524 m)   Wt 175 lb (79.4 kg)   SpO2 100%   BMI 34.18 kg/m         Assessment & Plan:   ARCELI CHILA comes in today with chief complaint of Annual Exam   Diagnosis and orders addressed:  1. Annual physical exam - CBC with Differential/Platelet - CMP14+EGFR - Lipid panel - Cytology - PAP  2. Acquired hypothyroidism Labs pending - Thyroid Panel With TSH  3. Anxiety Stress manageemnt  4. BMI 34.0-34.9,adult Discussed diet and exercise for person with BMI >25 Will recheck weight in 3-6 months   5. Vitamin D deficiency Continue daily vitamin d supplement - VITAMIN D 25 Hydroxy (Vit-D Deficiency, Fractures)   Labs pending Health Maintenance reviewed Diet and exercise encouraged  Follow up plan: 1 year   Mary-Margaret Daphine Deutscher, FNP

## 2022-10-27 LAB — CMP14+EGFR
ALT: 15 IU/L (ref 0–32)
AST: 20 IU/L (ref 0–40)
Albumin: 4.2 g/dL (ref 3.8–4.9)
Alkaline Phosphatase: 68 IU/L (ref 44–121)
BUN/Creatinine Ratio: 11 (ref 9–23)
BUN: 9 mg/dL (ref 6–24)
Bilirubin Total: 0.4 mg/dL (ref 0.0–1.2)
CO2: 24 mmol/L (ref 20–29)
Calcium: 9.4 mg/dL (ref 8.7–10.2)
Chloride: 104 mmol/L (ref 96–106)
Creatinine, Ser: 0.85 mg/dL (ref 0.57–1.00)
Globulin, Total: 2.1 g/dL (ref 1.5–4.5)
Glucose: 94 mg/dL (ref 70–99)
Potassium: 4.9 mmol/L (ref 3.5–5.2)
Sodium: 141 mmol/L (ref 134–144)
Total Protein: 6.3 g/dL (ref 6.0–8.5)
eGFR: 80 mL/min/{1.73_m2} (ref >59)

## 2022-10-27 LAB — CBC WITH DIFFERENTIAL/PLATELET
Basophils Absolute: 0 10*3/uL (ref 0.0–0.2)
Basos: 1 %
EOS (ABSOLUTE): 0.1 10*3/uL (ref 0.0–0.4)
Eos: 1 %
Hematocrit: 39.2 % (ref 34.0–46.6)
Hemoglobin: 13 g/dL (ref 11.1–15.9)
Immature Grans (Abs): 0 10*3/uL (ref 0.0–0.1)
Immature Granulocytes: 0 %
Lymphocytes Absolute: 1.6 10*3/uL (ref 0.7–3.1)
Lymphs: 36 %
MCH: 30.4 pg (ref 26.6–33.0)
MCHC: 33.2 g/dL (ref 31.5–35.7)
MCV: 92 fL (ref 79–97)
Monocytes Absolute: 0.3 10*3/uL (ref 0.1–0.9)
Monocytes: 6 %
Neutrophils Absolute: 2.5 10*3/uL (ref 1.4–7.0)
Neutrophils: 56 %
Platelets: 206 10*3/uL (ref 150–450)
RBC: 4.27 x10E6/uL (ref 3.77–5.28)
RDW: 12.2 % (ref 11.7–15.4)
WBC: 4.5 10*3/uL (ref 3.4–10.8)

## 2022-10-27 LAB — THYROID PANEL WITH TSH
Free Thyroxine Index: 2.3 (ref 1.2–4.9)
T3 Uptake Ratio: 28 % (ref 24–39)
T4, Total: 8.1 ug/dL (ref 4.5–12.0)
TSH: 0.984 u[IU]/mL (ref 0.450–4.500)

## 2022-10-27 LAB — LIPID PANEL
Chol/HDL Ratio: 2.3 ratio (ref 0.0–4.4)
Cholesterol, Total: 195 mg/dL (ref 100–199)
HDL: 86 mg/dL (ref >39)
LDL Chol Calc (NIH): 98 mg/dL (ref 0–99)
Triglycerides: 57 mg/dL (ref 0–149)
VLDL Cholesterol Cal: 11 mg/dL (ref 5–40)

## 2022-10-27 LAB — VITAMIN D 25 HYDROXY (VIT D DEFICIENCY, FRACTURES): Vit D, 25-Hydroxy: 29.1 ng/mL — ABNORMAL LOW (ref 30.0–100.0)

## 2022-10-28 LAB — CYTOLOGY - PAP: Diagnosis: NEGATIVE

## 2022-10-28 NOTE — Addendum Note (Signed)
Addended by: Cleda Daub on: 10/28/2022 01:38 PM   Modules accepted: Orders

## 2023-01-11 ENCOUNTER — Ambulatory Visit: Payer: BC Managed Care – PPO | Admitting: Nurse Practitioner

## 2023-01-11 ENCOUNTER — Encounter: Payer: Self-pay | Admitting: Nurse Practitioner

## 2023-01-11 VITALS — BP 138/81 | HR 65 | Temp 97.6°F | Resp 20 | Ht 60.0 in | Wt 174.2 lb

## 2023-01-11 DIAGNOSIS — B351 Tinea unguium: Secondary | ICD-10-CM

## 2023-01-11 DIAGNOSIS — R300 Dysuria: Secondary | ICD-10-CM | POA: Diagnosis not present

## 2023-01-11 LAB — MICROSCOPIC EXAMINATION
Bacteria, UA: NONE SEEN
Renal Epithel, UA: NONE SEEN /[HPF]
Yeast, UA: NONE SEEN

## 2023-01-11 LAB — URINALYSIS, COMPLETE
Bilirubin, UA: NEGATIVE
Glucose, UA: NEGATIVE
Ketones, UA: NEGATIVE
Leukocytes,UA: NEGATIVE
Nitrite, UA: NEGATIVE
Protein,UA: NEGATIVE
Specific Gravity, UA: 1.015 (ref 1.005–1.030)
Urobilinogen, Ur: 0.2 mg/dL (ref 0.2–1.0)
pH, UA: 6 (ref 5.0–7.5)

## 2023-01-11 MED ORDER — TERBINAFINE HCL 1 % EX CREA: 1.0000 | TOPICAL_CREAM | Freq: Two times a day (BID) | CUTANEOUS | 0 refills | Status: DC

## 2023-01-11 NOTE — Patient Instructions (Signed)

## 2023-01-11 NOTE — Progress Notes (Signed)
   Subjective:    Patient ID: Kristin Powell, female    DOB: 1965-02-25, 57 y.o.   MRN: [id number]   Chief Complaint: Urinary Tract Infection and Nail Problem   Urinary Tract Infection  Associated symptoms include frequency and urgency. Pertinent negatives include no chills.    Patient in today with 2 complaints: - UTI- started 3 weeks ago. Dysuria and frequency with slight back pain.  - nail fungus- bought some new shoes and then developed a fungus in right first nail bed. She has been using OTC meds which may have helped a little.  Patient Active Problem List   Diagnosis Date Noted   Anxiety 06/23/2017   Uterine leiomyoma 06/23/2017   Hypothyroidism 06/05/2012       Review of Systems  Constitutional:  Negative for chills, diaphoresis and fever.  Eyes:  Negative for pain.  Respiratory:  Negative for shortness of breath.   Cardiovascular:  Negative for chest pain, palpitations and leg swelling.  Gastrointestinal:  Negative for abdominal pain.  Endocrine: Negative for polydipsia.  Genitourinary:  Positive for dysuria, frequency and urgency.  Skin:  Negative for rash.  Neurological:  Negative for dizziness, weakness and headaches.  Hematological:  Does not bruise/bleed easily.  All other systems reviewed and are negative.      Objective:   Physical Exam Constitutional:      Appearance: Normal appearance. She is obese.  Cardiovascular:     Rate and Rhythm: Normal rate and regular rhythm.     Heart sounds: Normal heart sounds.  Pulmonary:     Effort: Pulmonary effort is normal.     Breath sounds: Normal breath sounds.  Abdominal:     Tenderness: There is no right CVA tenderness or left CVA tenderness.  Skin:    General: Skin is warm and dry.     Comments: Right first toenail bed is thick and white in color.  Neurological:     General: No focal deficit present.     Mental Status: She is oriented to person, place, and time.  Psychiatric:        Mood and Affect:  Mood normal.        Behavior: Behavior normal.     BP 138/81   Pulse 65   Temp 97.6 F (36.4 C) (Oral)   Resp 20   Ht 5' (1.524 m)   Wt 174 lb 4 oz (79 kg)   SpO2 100%   BMI 34.03 kg/m   Urine clear     Assessment & Plan:   Kristin Powell in today with chief complaint of Urinary Tract Infection and Nail Problem   1. Dysuria Urine clear Force fluids - Urine Culture - Urinalysis, Complete  2. Onychomycosis Keep clean and dry RTO prn May take several months to resolve - terbinafine (LAMISIL AT) 1 % cream; Apply 1 Application topically 2 (two) times daily.  Dispense: 30 g; Refill: 0    The above assessment and management plan was discussed with the patient. The patient verbalized understanding of and has agreed to the management plan. Patient is aware to call the clinic if symptoms persist or worsen. Patient is aware when to return to the clinic for a follow-up visit. Patient educated on when it is appropriate to go to the emergency department.   Mary-Margaret Daphine Deutscher, FNP

## 2023-01-13 LAB — URINE CULTURE: Organism ID, Bacteria: NO GROWTH

## 2023-03-11 ENCOUNTER — Encounter: Payer: Self-pay | Admitting: Nurse Practitioner

## 2023-03-11 ENCOUNTER — Ambulatory Visit: Payer: BC Managed Care – PPO | Admitting: Nurse Practitioner

## 2023-03-11 VITALS — BP 121/80 | HR 66 | Temp 97.2°F | Ht 60.0 in | Wt 175.0 lb

## 2023-03-11 DIAGNOSIS — B372 Candidiasis of skin and nail: Secondary | ICD-10-CM

## 2023-03-11 MED ORDER — NYSTATIN 100000 UNIT/GM EX CREA
1.0000 | TOPICAL_CREAM | Freq: Two times a day (BID) | CUTANEOUS | 0 refills | Status: AC
Start: 2023-03-11 — End: ?

## 2023-03-11 NOTE — Progress Notes (Signed)
   Subjective:    Patient ID: Kristin Powell, female    DOB: 12-11-1965, 58 y.o.   MRN: [id number]   Chief Complaint: rash  Rash This is a new problem. The current episode started 1 to 4 weeks ago. The problem has been waxing and waning since onset. Location: left breast. The rash is characterized by redness and itchiness. She was exposed to nothing. Pertinent negatives include no eye pain or shortness of breath. The treatment provided no relief.    Patient Active Problem List   Diagnosis Date Noted   Anxiety 06/23/2017   Uterine leiomyoma 06/23/2017   Hypothyroidism 06/05/2012       Review of Systems  Constitutional:  Negative for diaphoresis.  Eyes:  Negative for pain.  Respiratory:  Negative for shortness of breath.   Cardiovascular:  Negative for chest pain, palpitations and leg swelling.  Gastrointestinal:  Negative for abdominal pain.  Endocrine: Negative for polydipsia.  Skin:  Positive for rash.  Neurological:  Negative for dizziness, weakness and headaches.  Hematological:  Does not bruise/bleed easily.  All other systems reviewed and are negative.      Objective:   Physical Exam Constitutional:      Appearance: Normal appearance. She is obese.  Cardiovascular:     Rate and Rhythm: Normal rate and regular rhythm.     Heart sounds: Normal heart sounds.  Pulmonary:     Effort: Pulmonary effort is normal.     Breath sounds: Normal breath sounds.  Skin:    General: Skin is warm.     Comments: Erythematous moist rash left mammary fold  Neurological:     General: No focal deficit present.     Mental Status: She is alert and oriented to person, place, and time.  Psychiatric:        Mood and Affect: Mood normal.        Behavior: Behavior normal.    BP 121/80   Pulse 66   Temp (!) 97.2 F (36.2 C) (Temporal)   Ht 5' (1.524 m)   Wt 175 lb (79.4 kg)   SpO2 100%   BMI 34.18 kg/m         Assessment & Plan:   Kristin Powell in today with chief  complaint of Rash (Under breast x 2 weeks )   1. Skin candidiasis (Primary) Keep area dry Avoid scratching RTO prn    The above assessment and management plan was discussed with the patient. The patient verbalized understanding of and has agreed to the management plan. Patient is aware to call the clinic if symptoms persist or worsen. Patient is aware when to return to the clinic for a follow-up visit. Patient educated on when it is appropriate to go to the emergency department.   Mary-Margaret Daphine Deutscher, FNP

## 2023-03-11 NOTE — Patient Instructions (Signed)
Skin Yeast Infection  A skin yeast infection is a condition in which there is an overgrowth of yeast (Candida) that normally lives on the skin. This condition usually occurs in areas of the skin that are constantly warm and moist, such as the skin under the breasts or armpits, or in the groin and other body folds. What are the causes? This condition is caused by a change in the normal balance of the yeast that live on the skin. What increases the risk? You are more likely to develop this condition if you: Are obese. Are pregnant. Are 44 years of age or older. Wear tight clothing. Have any of the following conditions: Diabetes. Malnutrition. A weak body defense system (immune system). Take medicines such as: Birth control pills. Antibiotics. Steroid medicines. What are the signs or symptoms? The most common symptom of this condition is itchiness in the affected area. Other symptoms include: A red, swollen area of the skin. Bumps on the skin. How is this diagnosed? This condition is diagnosed with a medical history and physical exam. Your health care provider may check for yeast by taking scrapings of the skin to be viewed under a microscope. How is this treated? This condition is treated with medicine. Medicines may be prescribed or available over the counter. The medicines may be: Taken by mouth (orally). Applied as a cream or powder to your skin. Follow these instructions at home:  Take or apply over-the-counter and prescription medicines only as told by your health care provider. Maintain a healthy weight. If you need help losing weight, talk with your health care provider. Keep your skin clean and dry. Wear loose-fitting clothing. If you have diabetes, keep your blood sugar under control. Keep all follow-up visits. This is important. Contact a health care provider if: Your symptoms go away and then come back. Your symptoms do not get better with treatment. Your symptoms get  worse. Your rash spreads. You have a fever or chills. You have new symptoms. You have new warmth or redness of your skin. Your rash is painful or bleeding. Summary A skin yeast infection is a condition in which there is an overgrowth of yeast (Candida) that normally lives on the skin. Take or apply over-the-counter and prescription medicines only as told by your health care provider. Keep your skin clean and dry. Contact a health care provider if your symptoms do not get better with treatment. This information is not intended to replace advice given to you by your health care provider. Make sure you discuss any questions you have with your health care provider. Document Revised: 04/29/2020 Document Reviewed: 04/29/2020 Elsevier Patient Education  2024 [employer].

## 2023-05-02 ENCOUNTER — Ambulatory Visit: Payer: Self-pay | Admitting: Nurse Practitioner

## 2023-05-02 NOTE — Telephone Encounter (Signed)
 Appt made

## 2023-05-02 NOTE — Telephone Encounter (Signed)
   Chief Complaint: R lower back/side pain  Symptoms: back/side pain, fatigue Frequency: 1-2 weeks Pertinent Negatives: Patient denies abdominal pain Disposition: [] ED /[] Urgent Care (no appt availability in office) / [x] Appointment(In office/virtual)/ []  [facility]/ [] Home Care/ [] Refused Recommended Disposition /[] [facility]/ []  Follow-up with PCP Additional Notes: Patient has been scheduled for appointment for evaluation   Copied from CRM [id number]. Topic: Clinical - Red Word Triage >> May 02, 2023 11:52 AM [place] B wrote: [facility] that prompted transfer to Nurse Triage: pt has pain in back and side. nausea Reason for Disposition  [1] MODERATE back pain (e.g., interferes with normal activities) AND [2] present > 3 days  Answer Assessment - Initial Assessment Questions 1. ONSET: "When did the pain begin?"      1-2 weeks 2. LOCATION: "Where does it hurt?" (upper, mid or lower back)     R lower back pain, R side 3. SEVERITY: "How bad is the pain?"  (e.g., Scale 1-10; mild, moderate, or severe)   - MILD (1-3): Doesn't interfere with normal activities.    - MODERATE (4-7): Interferes with normal activities or awakens from sleep.    - SEVERE (8-10): Excruciating pain, unable to do any normal activities.      Severe when occurring 4. PATTERN: "Is the pain constant?" (e.g., yes, no; constant, intermittent)      Comes and goes- worse in am- gets better after BM, pain with urination at night 5. RADIATION: "Does the pain shoot into your legs or somewhere else?"    no 6. CAUSE:  "What do you think is causing the back pain?"      unsure 7. BACK OVERUSE:  "Any recent lifting of heavy objects, strenuous work or exercise?"     Fell in kitchen- 2 weeks ago 8. MEDICINES: "What have you taken so far for the pain?" (e.g., nothing, acetaminophen, NSAIDS)     Motrin- occasional  9. NEUROLOGIC SYMPTOMS: "Do you have any weakness, numbness, or problems with bowel/bladder  control?"     no 10. OTHER SYMPTOMS: "Do you have any other symptoms?" (e.g., fever, abdomen pain, burning with urination, blood in urine)       fatigue  Protocols used: Back Pain-A-AH

## 2023-05-03 ENCOUNTER — Ambulatory Visit: Admitting: Nurse Practitioner

## 2023-05-03 ENCOUNTER — Encounter: Payer: Self-pay | Admitting: Nurse Practitioner

## 2023-05-03 VITALS — BP 129/80 | HR 63 | Temp 97.0°F | Ht 61.0 in | Wt 173.0 lb

## 2023-05-03 DIAGNOSIS — R1011 Right upper quadrant pain: Secondary | ICD-10-CM

## 2023-05-03 DIAGNOSIS — R300 Dysuria: Secondary | ICD-10-CM

## 2023-05-03 LAB — CMP14+EGFR
ALT: 15 IU/L (ref 0–32)
AST: 22 IU/L (ref 0–40)
Albumin: 4.3 g/dL (ref 3.8–4.9)
Alkaline Phosphatase: 75 IU/L (ref 44–121)
BUN/Creatinine Ratio: 18 (ref 9–23)
BUN: 14 mg/dL (ref 6–24)
Bilirubin Total: 0.4 mg/dL (ref 0.0–1.2)
CO2: 24 mmol/L (ref 20–29)
Calcium: 9.3 mg/dL (ref 8.7–10.2)
Chloride: 101 mmol/L (ref 96–106)
Creatinine, Ser: 0.76 mg/dL (ref 0.57–1.00)
Globulin, Total: 2.4 g/dL (ref 1.5–4.5)
Glucose: 98 mg/dL (ref 70–99)
Potassium: 4.2 mmol/L (ref 3.5–5.2)
Sodium: 139 mmol/L (ref 134–144)
Total Protein: 6.7 g/dL (ref 6.0–8.5)
eGFR: 91 mL/min/{1.73_m2} (ref >59)

## 2023-05-03 LAB — URINALYSIS, COMPLETE
Bilirubin, UA: NEGATIVE
Glucose, UA: NEGATIVE
Ketones, UA: NEGATIVE
Leukocytes,UA: NEGATIVE
Nitrite, UA: NEGATIVE
Protein,UA: NEGATIVE
Specific Gravity, UA: 1.01 (ref 1.005–1.030)
Urobilinogen, Ur: 0.2 mg/dL (ref 0.2–1.0)
pH, UA: 7 (ref 5.0–7.5)

## 2023-05-03 LAB — MICROSCOPIC EXAMINATION
Bacteria, UA: NONE SEEN
Epithelial Cells (non renal): NONE SEEN /HPF (ref 0–10)
Renal Epithel, UA: NONE SEEN /HPF
Yeast, UA: NONE SEEN

## 2023-05-03 NOTE — Patient Instructions (Signed)
 Gallbladder Eating Plan High blood cholesterol, obesity, a sedentary lifestyle, an unhealthy diet, and diabetes are risk factors for developing gallstones. If you have a gallbladder condition, you may have trouble digesting fats and tolerating high fat intake. Eating a low-fat diet can help reduce your symptoms and may be helpful before and after having surgery to remove your gallbladder (cholecystectomy). Your health care provider may recommend that you work with a dietitian to help you reduce the amount of fat in your diet. What are tips for following this plan? General guidelines Limit your fat intake to less than 30% of your total daily calories. If you eat around 1,800 calories each day, this means eating less than 60 grams (g) of fat per day. Fat is an important part of a healthy diet. Eating a low-fat diet can make it hard to maintain a healthy body weight. Ask your dietitian how much fat, calories, and other nutrients you need each day. Eat small, frequent meals throughout the day instead of three large meals. Drink at least 8-10 cups (1.9-2.4 L) of fluid a day. Drink enough fluid to keep your urine pale yellow. If you drink alcohol: Limit how much you have to: 0-1 drink a day for women who are not pregnant. 0-2 drinks a day for men. Know how much alcohol is in a drink. In the [place] one drink equals one 12 oz bottle of beer (355 mL), one 5 oz glass of wine (148 mL), or one 1 oz glass of hard liquor (44 mL). Reading food labels  Check nutrition facts on food labels for the amount of fat per serving. Choose foods with less than 3 grams of fat per serving. Shopping Choose nonfat and low-fat healthy foods. Look for the words "nonfat," "low-fat," or "fat-free." Avoid buying processed or prepackaged foods. Cooking Cook using low-fat methods, such as baking, broiling, grilling, or boiling. Cook with small amounts of healthy fats, such as olive oil, grapeseed oil, canola oil, avocado oil, or  sunflower oil. What foods are recommended?  All fresh, frozen, or canned fruits and vegetables. Whole grains. Low-fat or nonfat (skim) milk and yogurt. Lean meat, skinless poultry, fish, eggs, and beans. Low-fat protein supplement powders or drinks. Spices and herbs. The items listed above may not be a complete list of foods and beverages you can eat and drink. Contact a dietitian for more information. What foods are not recommended? High-fat foods. These include baked goods, fast food, fatty cuts of meat, ice cream, french toast, sweet rolls, pizza, cheese bread, foods covered with butter, creamy sauces, or cheese. Fried foods. These include french fries, tempura, battered fish, breaded chicken, fried breads, and sweets. Foods that cause bloating and gas. The items listed above may not be a complete list of foods that you should avoid. Contact a dietitian for more information. Summary A low-fat diet can be helpful if you have a gallbladder condition, or before and after gallbladder surgery. Limit your fat intake to less than 30% of your total daily calories. This is about 60 g of fat if you eat 1,800 calories each day. Eat small, frequent meals throughout the day instead of three large meals. This information is not intended to replace advice given to you by your health care provider. Make sure you discuss any questions you have with your health care provider. Document Revised: 01/23/2021 Document Reviewed: 01/23/2021 Elsevier Patient Education  2024 [employer].

## 2023-05-03 NOTE — Progress Notes (Signed)
   Subjective:    Patient ID: Kristin Powell, female    DOB: 04-Mar-1965, 58 y.o.   MRN: [id number]   Chief Complaint: Back Pain and Abdominal Pain (Right side. Concerned that her color is yellow)   HPI  Patient in today c/o abdominal pain and back pain. Mainly in right upper quadrant and radiates to her back. Stabbing pain. Started about 2-3 weeks ago. Rates pain 10/10. Having bowel movement will gradually cause pain to go away. Patient says she was told she looks yellow in color.  Patient Active Problem List   Diagnosis Date Noted   Anxiety 06/23/2017   Uterine leiomyoma 06/23/2017   Hypothyroidism 06/05/2012       Review of Systems  Constitutional:  Negative for activity change, appetite change, chills and fever.  Gastrointestinal:  Positive for abdominal pain and nausea. Negative for constipation and diarrhea.  Genitourinary: Negative.        Objective:   Physical Exam Constitutional:      Appearance: She is well-developed. She is obese.  Cardiovascular:     Rate and Rhythm: Normal rate and regular rhythm.     Heart sounds: Normal heart sounds.  Pulmonary:     Effort: Pulmonary effort is normal.     Breath sounds: Normal breath sounds.  Abdominal:     General: There is no distension.     Tenderness: There is abdominal tenderness (right upper quadrant pain).  Skin:    General: Skin is warm.  Neurological:     General: No focal deficit present.     Mental Status: She is alert and oriented to person, place, and time.  Psychiatric:        Mood and Affect: Mood normal.        Behavior: Behavior normal.     BP 129/80   Pulse 63   Temp (!) 97 F (36.1 C) (Temporal)   Ht 5\' 1"  (1.549 m)   Wt 173 lb (78.5 kg)   SpO2 100%   BMI 32.69 kg/m   Urine clear     Assessment & Plan:   Kristin Powell in today with chief complaint of Back Pain and Abdominal Pain (Right side. Concerned that her color is yellow)   1. Dysuria (Primary) Urine clear Force fluids -  Urinalysis, Complete - Urine Culture  2. RUQ pain Low fat diet- avoid spicy and fatty foods - CMP14+EGFR - US Abdomen Limited RUQ (LIVER/GB); Future    The above assessment and management plan was discussed with the patient. The patient verbalized understanding of and has agreed to the management plan. Patient is aware to call the clinic if symptoms persist or worsen. Patient is aware when to return to the clinic for a follow-up visit. Patient educated on when it is appropriate to go to the emergency department.   Mary-Margaret Daphine Deutscher, FNP

## 2023-05-05 LAB — URINE CULTURE

## 2023-05-06 MED ORDER — AMOXICILLIN-POT CLAVULANATE 875-125 MG PO TABS: 1.0000 | ORAL_TABLET | Freq: Two times a day (BID) | ORAL | 0 refills | Status: DC

## 2023-05-06 NOTE — Addendum Note (Signed)
 Addended by: Bennie Pierini on: 05/06/2023 09:17 AM   Modules accepted: Orders

## 2023-05-16 ENCOUNTER — Telehealth: Payer: Self-pay | Admitting: Nurse Practitioner

## 2023-05-16 ENCOUNTER — Ambulatory Visit (HOSPITAL_COMMUNITY)
Admission: RE | Admit: 2023-05-16 | Discharge: 2023-05-16 | Disposition: A | Discharge status: Home / Self Care | Source: Ambulatory Visit | Attending: Nurse Practitioner | Admitting: Nurse Practitioner

## 2023-05-16 DIAGNOSIS — R1011 Right upper quadrant pain: Secondary | ICD-10-CM | POA: Diagnosis not present

## 2023-05-16 DIAGNOSIS — K7689 Other specified diseases of liver: Secondary | ICD-10-CM | POA: Diagnosis not present

## 2023-05-16 DIAGNOSIS — K8020 Calculus of gallbladder without cholecystitis without obstruction: Secondary | ICD-10-CM | POA: Diagnosis not present

## 2023-05-16 NOTE — Telephone Encounter (Signed)
 This was in reference to 3rd attempt to contact Patient concerning [place] Appt.

## 2023-10-28 ENCOUNTER — Encounter: Payer: BC Managed Care – PPO | Admitting: Nurse Practitioner

## 2023-12-06 ENCOUNTER — Other Ambulatory Visit: Payer: Self-pay | Admitting: Nurse Practitioner

## 2023-12-06 DIAGNOSIS — E039 Hypothyroidism, unspecified: Secondary | ICD-10-CM

## 2023-12-06 NOTE — Telephone Encounter (Signed)
 Copied from CRM [id number]. Topic: Clinical - Medication Refill >> Dec 06, 2023  4:57 PM Teressa P wrote: Medication: Levothyroxine  75 mcg   Has the patient contacted their pharmacy? No  This is the patient's preferred pharmacy:  [id number] - [place], [place] - [address] [place] [address] Phone: [phone] Fax: [phone]  Is this the correct pharmacy for this prescription? Yes If no, delete pharmacy and type the correct one.   Has the prescription been filled recently? Yes  Is the patient out of the medication? No  Has the patient been seen for an appointment in the last year OR does the patient have an upcoming appointment? yes  Can we respond through MyChart? No  Agent: Please be advised that Rx refills may take up to 3 business days. We ask that you follow-up with your pharmacy.

## 2023-12-08 MED ORDER — LEVOTHYROXINE SODIUM 50 MCG PO TABS: 50.0000 ug | ORAL_TABLET | Freq: Every day | ORAL | 3 refills | Status: DC

## 2023-12-12 ENCOUNTER — Other Ambulatory Visit: Payer: Self-pay | Admitting: Nurse Practitioner

## 2023-12-12 DIAGNOSIS — Z1231 Encounter for screening mammogram for malignant neoplasm of breast: Secondary | ICD-10-CM

## 2023-12-14 NOTE — Telephone Encounter (Unsigned)
 Copied from CRM [id number]. Topic: Clinical - Prescription Issue >> Dec 14, 2023 11:13 AM Delon DASEN wrote: Reason for CRM: levothyroxine  (SYNTHROID )- dosage is supposed to be 75 mcg, 50 mcg was called in, please correct

## 2023-12-15 MED ORDER — LEVOTHYROXINE SODIUM 75 MCG PO TABS: 75.0000 ug | ORAL_TABLET | Freq: Every day | ORAL | 1 refills | Status: AC

## 2024-01-18 ENCOUNTER — Ambulatory Visit
Admission: RE | Admit: 2024-01-18 | Discharge: 2024-01-18 | Disposition: A | Discharge status: Home / Self Care | Source: Ambulatory Visit | Attending: Nurse Practitioner | Admitting: Nurse Practitioner

## 2024-01-18 DIAGNOSIS — Z1231 Encounter for screening mammogram for malignant neoplasm of breast: Secondary | ICD-10-CM

## 2024-01-23 ENCOUNTER — Telehealth: Payer: Self-pay | Admitting: Nurse Practitioner

## 2024-01-23 NOTE — Telephone Encounter (Signed)
 Left patient message that needs mammogram.  Mary-Margaret Gladis, FNP

## 2024-03-14 ENCOUNTER — Ambulatory Visit: Payer: Self-pay

## 2024-03-14 NOTE — Telephone Encounter (Signed)
 FYI Only or Action Required?: FYI only for provider: appointment scheduled on 03/17/23.  Patient was last seen in primary care on 05/03/2023 by Gladis Mustard, FNP.  Called Nurse Triage reporting Joint Pain.  Symptoms began several years ago.  Interventions attempted: OTC medications: ibuprofen; tylenol  arthritis and Other: stretching.  Symptoms are: gradually worsening.  Triage Disposition: See PCP Within 2 Weeks  Patient/caregiver understands and will follow disposition?: Yes     Message from [name] sent at 03/14/2024  5:04 PM EST  Reason for Triage: Joints aching really bad.     Reason for Disposition  Hip pain is a chronic symptom (recurrent or ongoing AND present > 4 weeks)  Answer Assessment - Initial Assessment Questions Pt called to report worsening joint pain at her hips that radiates into her legs bilaterally x 2-3 years. Pt reports having xrays in the past and was told she had arthritis but no further tx at the time. Pt was taking ibuprofen for pain relief but felt it was doing something to her immune system so d/c and started tylenol . Pt d/c tylenol  as she felt she kept getting UTIs and her skin had a yellow hue. Pt was trying stretches but felt they made pain worse. Pt is not doing any interventions at this time. Pt denies hx of sciatic pain, no complications with urination or bowels. Pt reports occasional numbness/tingling in lower extremities. Pt also reports having lab work done for her job and was told to f/u with PCP d/t higher cholesterol levels. Pt does not have hx of high cholesterol and no medications. Appointment scheduled for evaluation. Patient agrees with plan of care, and will call back if anything changes, or if symptoms worsen.       1. LOCATION and RADIATION: Where is the pain located? Does the pain spread (shoot) anywhere else?     Bilateral hips to lower back to lower extremities   2. QUALITY: What does the pain feel like?  (e.g.,  sharp, dull, aching, burning)     Aching pain   3. SEVERITY: How bad is the pain? What does it keep you from doing?   (Scale 1-10; or mild, moderate, severe)     10/10 when flared; pt reports at work she notices pain but able to work through and no issues with walking   4. ONSET: When did the pain start? Does it come and go, or is it there all the time?     2-3 years   5. WORK OR EXERCISE: Has there been any recent work or exercise that involved this part of the body?      No   6. CAUSE: What do you think is causing the hip pain?      Unsure   7. AGGRAVATING FACTORS: What makes the hip pain worse? (e.g., walking, climbing stairs, running)     Stretching   8. OTHER SYMPTOMS: Do you have any other symptoms? (e.g., back pain, pain shooting down leg,  fever, rash)     No  Protocols used: Hip Pain-A-AH

## 2024-03-15 NOTE — Telephone Encounter (Signed)
 noted

## 2024-03-16 ENCOUNTER — Ambulatory Visit: Admitting: Nurse Practitioner

## 2024-03-16 VITALS — BP 137/78 | HR 76 | Temp 97.6°F | Ht 61.0 in | Wt 181.0 lb

## 2024-03-16 DIAGNOSIS — M79605 Pain in left leg: Secondary | ICD-10-CM

## 2024-03-16 DIAGNOSIS — M79604 Pain in right leg: Secondary | ICD-10-CM

## 2024-03-16 DIAGNOSIS — E7800 Pure hypercholesterolemia, unspecified: Secondary | ICD-10-CM

## 2024-03-16 MED ORDER — CELECOXIB 200 MG PO CAPS: 200.0000 mg | ORAL_CAPSULE | Freq: Two times a day (BID) | ORAL | 2 refills | Status: AC

## 2024-03-16 MED ORDER — METHYLPREDNISOLONE ACETATE 80 MG/ML IJ SUSP
80.0000 mg | Freq: Once | INTRAMUSCULAR | Status: AC
  Administered 2024-03-16: 80 mg via INTRAMUSCULAR

## 2024-03-16 MED ORDER — KETOROLAC TROMETHAMINE 60 MG/2ML IM SOLN
60.0000 mg | Freq: Once | INTRAMUSCULAR | Status: AC
  Administered 2024-03-16: 60 mg via INTRAMUSCULAR

## 2024-03-16 NOTE — Patient Instructions (Signed)

## 2024-03-16 NOTE — Progress Notes (Signed)
 "  Subjective:    Patient ID: Kristin Powell, female    DOB: November 13, 1965, 59 y.o.   MRN: [id number]   Chief Complaint: bilateral hip pain (Radiating down legs/) and Discuss cholesterol (Had labs done at work and they said cholesterol was high)   HPI Patient here with 2 issues: - bilateral hip pain- started off and on for several years. Pain is aching. Radiates down legs. At its worse when laying down, or standing in one spot for a  long time. Takes motrin when needed which helps.  - had labs done at work and cholesterol was high. LDL were 110.  The 10-year ASCVD risk score (Arnett DK, et al., 2019) is: 2.1%  Patient Active Problem List   Diagnosis Date Noted   Anxiety 06/23/2017   Uterine leiomyoma 06/23/2017   Hypothyroidism 06/05/2012       Review of Systems  Constitutional:  Negative for diaphoresis.  Eyes:  Negative for pain.  Respiratory:  Negative for shortness of breath.   Cardiovascular:  Negative for chest pain, palpitations and leg swelling.  Gastrointestinal:  Negative for abdominal pain.  Endocrine: Negative for polydipsia.  Skin:  Negative for rash.  Neurological:  Negative for dizziness, weakness and headaches.  Hematological:  Does not bruise/bleed easily.  All other systems reviewed and are negative.      Objective:   Physical Exam Vitals and nursing note reviewed.  Constitutional:      General: She is not in acute distress.    Appearance: Normal appearance. She is well-developed.  Neck:     Vascular: No carotid bruit or JVD.  Cardiovascular:     Rate and Rhythm: Normal rate and regular rhythm.     Heart sounds: Normal heart sounds.  Pulmonary:     Effort: Pulmonary effort is normal. No respiratory distress.     Breath sounds: Normal breath sounds. No wheezing or rales.  Chest:     Chest wall: No tenderness.  Abdominal:     General: There is no abdominal bruit.     Palpations: There is no hepatomegaly, splenomegaly or pulsatile mass.   Musculoskeletal:        General: Normal range of motion.     Cervical back: Normal range of motion and neck supple.  Lymphadenopathy:     Cervical: No cervical adenopathy.  Skin:    General: Skin is warm and dry.  Neurological:     Mental Status: She is alert and oriented to person, place, and time.     Deep Tendon Reflexes: Reflexes are normal and symmetric.  Psychiatric:        Behavior: Behavior normal.        Thought Content: Thought content normal.        Judgment: Judgment normal.     BP 137/78   Pulse 76   Temp 97.6 F (36.4 C) (Temporal)   Ht 5' 1 (1.549 m)   Wt 181 lb (82.1 kg)   SpO2 100%   BMI 34.20 kg/m        Assessment & Plan:   Kristin Powell in today with chief complaint of bilateral hip pain (Radiating down legs/) and Discuss cholesterol (Had labs done at work and they said cholesterol was high)   1. Bilateral leg pain (Primary) Keep legs warm at night - celecoxib  (CELEBREX ) 200 MG capsule; Take 1 capsule (200 mg total) by mouth 2 (two) times daily.  Dispense: 60 capsule; Refill: 2 - methylPREDNISolone  acetate (DEPO-MEDROL ) injection 80 mg -  ketorolac (TORADOL) injection 60 mg  2. Pure hypercholesterolemia Low fat diet Exercise We will keep a watch on levels    The above assessment and management plan was discussed with the patient. The patient verbalized understanding of and has agreed to the management plan. Patient is aware to call the clinic if symptoms persist or worsen. Patient is aware when to return to the clinic for a follow-up visit. Patient educated on when it is appropriate to go to the emergency department.   Mary-Margaret Gladis, FNP   "
